# Patient Record
Sex: Female | Born: 1955 | Race: White | Hispanic: No | Marital: Married | Smoking: Former smoker
Health system: Southern US, Community
[De-identification: ages and names within clinical notes are randomized; demographics above are authoritative.]

## PROBLEM LIST (undated history)

## (undated) DIAGNOSIS — F419 Anxiety disorder, unspecified: Secondary | ICD-10-CM

## (undated) DIAGNOSIS — R339 Retention of urine, unspecified: Secondary | ICD-10-CM

## (undated) DIAGNOSIS — N200 Calculus of kidney: Secondary | ICD-10-CM

## (undated) DIAGNOSIS — N83209 Unspecified ovarian cyst, unspecified side: Secondary | ICD-10-CM

## (undated) DIAGNOSIS — K589 Irritable bowel syndrome without diarrhea: Secondary | ICD-10-CM

## (undated) DIAGNOSIS — N390 Urinary tract infection, site not specified: Secondary | ICD-10-CM

## (undated) DIAGNOSIS — I10 Essential (primary) hypertension: Secondary | ICD-10-CM

## (undated) DIAGNOSIS — T7840XA Allergy, unspecified, initial encounter: Secondary | ICD-10-CM

## (undated) DIAGNOSIS — K219 Gastro-esophageal reflux disease without esophagitis: Secondary | ICD-10-CM

## (undated) DIAGNOSIS — K5790 Diverticulosis of intestine, part unspecified, without perforation or abscess without bleeding: Secondary | ICD-10-CM

## (undated) DIAGNOSIS — K2970 Gastritis, unspecified, without bleeding: Secondary | ICD-10-CM

## (undated) DIAGNOSIS — R079 Chest pain, unspecified: Secondary | ICD-10-CM

## (undated) DIAGNOSIS — R200 Anesthesia of skin: Secondary | ICD-10-CM

## (undated) DIAGNOSIS — E785 Hyperlipidemia, unspecified: Secondary | ICD-10-CM

## (undated) DIAGNOSIS — I7300 Raynaud's syndrome without gangrene: Secondary | ICD-10-CM

## (undated) DIAGNOSIS — F329 Major depressive disorder, single episode, unspecified: Secondary | ICD-10-CM

## (undated) DIAGNOSIS — G4700 Insomnia, unspecified: Secondary | ICD-10-CM

## (undated) DIAGNOSIS — C4490 Unspecified malignant neoplasm of skin, unspecified: Secondary | ICD-10-CM

## (undated) DIAGNOSIS — F32A Depression, unspecified: Secondary | ICD-10-CM

## (undated) DIAGNOSIS — I420 Dilated cardiomyopathy: Secondary | ICD-10-CM

## (undated) DIAGNOSIS — I447 Left bundle-branch block, unspecified: Secondary | ICD-10-CM

## (undated) DIAGNOSIS — R002 Palpitations: Secondary | ICD-10-CM

## (undated) DIAGNOSIS — H43393 Other vitreous opacities, bilateral: Secondary | ICD-10-CM

## (undated) DIAGNOSIS — C44509 Unspecified malignant neoplasm of skin of other part of trunk: Secondary | ICD-10-CM

## (undated) DIAGNOSIS — J45909 Unspecified asthma, uncomplicated: Secondary | ICD-10-CM

## (undated) DIAGNOSIS — I5189 Other ill-defined heart diseases: Secondary | ICD-10-CM

## (undated) DIAGNOSIS — I872 Venous insufficiency (chronic) (peripheral): Secondary | ICD-10-CM

## (undated) DIAGNOSIS — R0602 Shortness of breath: Secondary | ICD-10-CM

## (undated) HISTORY — PX: CHOLECYSTECTOMY: SHX55

## (undated) HISTORY — DX: Unspecified malignant neoplasm of skin, unspecified: C44.90

## (undated) HISTORY — PX: FUNCTIONAL ENDOSCOPIC SINUS SURGERY: SUR616

---
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
## 2004-05-29 ENCOUNTER — Ambulatory Visit: Payer: Self-pay | Admitting: Internal Medicine

## 2004-06-14 ENCOUNTER — Ambulatory Visit: Payer: Self-pay

## 2004-06-17 ENCOUNTER — Ambulatory Visit: Payer: Self-pay

## 2004-07-30 ENCOUNTER — Ambulatory Visit: Payer: Self-pay | Admitting: Internal Medicine

## 2004-10-28 ENCOUNTER — Ambulatory Visit: Payer: Self-pay

## 2004-12-17 ENCOUNTER — Ambulatory Visit: Payer: Self-pay | Admitting: Otolaryngology

## 2005-10-14 ENCOUNTER — Ambulatory Visit: Payer: Self-pay | Admitting: Obstetrics and Gynecology

## 2005-10-17 ENCOUNTER — Ambulatory Visit: Payer: Self-pay | Admitting: Obstetrics and Gynecology

## 2006-08-24 ENCOUNTER — Ambulatory Visit: Payer: Self-pay | Admitting: Gastroenterology

## 2006-10-26 ENCOUNTER — Ambulatory Visit: Payer: Self-pay | Admitting: Cardiology

## 2006-11-10 ENCOUNTER — Ambulatory Visit: Payer: Self-pay | Admitting: Obstetrics and Gynecology

## 2007-05-31 ENCOUNTER — Emergency Department: Payer: Self-pay | Admitting: Emergency Medicine

## 2007-05-31 ENCOUNTER — Other Ambulatory Visit: Payer: Self-pay

## 2007-11-09 ENCOUNTER — Emergency Department (HOSPITAL_COMMUNITY): Admission: EM | Admit: 2007-11-09 | Discharge: 2007-11-09 | Payer: Self-pay | Admitting: Emergency Medicine

## 2007-11-10 ENCOUNTER — Ambulatory Visit: Payer: Self-pay

## 2007-11-12 ENCOUNTER — Ambulatory Visit: Payer: Self-pay | Admitting: Obstetrics and Gynecology

## 2008-05-18 ENCOUNTER — Ambulatory Visit: Payer: Self-pay | Admitting: Internal Medicine

## 2008-05-29 ENCOUNTER — Ambulatory Visit: Payer: Self-pay | Admitting: Urology

## 2009-11-27 ENCOUNTER — Ambulatory Visit: Payer: Self-pay | Admitting: Internal Medicine

## 2010-08-04 ENCOUNTER — Emergency Department: Payer: Self-pay | Admitting: Emergency Medicine

## 2010-09-17 ENCOUNTER — Ambulatory Visit: Payer: Self-pay | Admitting: Urology

## 2010-10-22 ENCOUNTER — Ambulatory Visit: Payer: Self-pay | Admitting: Gastroenterology

## 2010-11-07 ENCOUNTER — Ambulatory Visit: Payer: Self-pay | Admitting: Internal Medicine

## 2010-12-04 ENCOUNTER — Ambulatory Visit: Payer: Self-pay | Admitting: Internal Medicine

## 2010-12-05 ENCOUNTER — Ambulatory Visit: Payer: Self-pay | Admitting: Surgery

## 2010-12-06 LAB — PATHOLOGY REPORT

## 2011-04-22 LAB — D-DIMER, QUANTITATIVE: D-Dimer, Quant: 0.33

## 2011-08-15 ENCOUNTER — Ambulatory Visit: Payer: Self-pay | Admitting: Internal Medicine

## 2011-12-08 ENCOUNTER — Ambulatory Visit: Payer: Self-pay

## 2011-12-09 ENCOUNTER — Ambulatory Visit: Payer: Self-pay

## 2012-12-09 ENCOUNTER — Ambulatory Visit: Payer: Self-pay | Admitting: Obstetrics & Gynecology

## 2012-12-16 DIAGNOSIS — R339 Retention of urine, unspecified: Secondary | ICD-10-CM

## 2012-12-16 DIAGNOSIS — N3020 Other chronic cystitis without hematuria: Secondary | ICD-10-CM | POA: Insufficient documentation

## 2012-12-16 DIAGNOSIS — N3941 Urge incontinence: Secondary | ICD-10-CM | POA: Insufficient documentation

## 2012-12-16 DIAGNOSIS — N200 Calculus of kidney: Secondary | ICD-10-CM

## 2012-12-16 HISTORY — DX: Retention of urine, unspecified: R33.9

## 2012-12-16 HISTORY — DX: Calculus of kidney: N20.0

## 2013-03-01 ENCOUNTER — Emergency Department: Payer: Self-pay | Admitting: Emergency Medicine

## 2013-03-01 LAB — URINALYSIS, COMPLETE
Ketone: NEGATIVE
Leukocyte Esterase: NEGATIVE
Protein: NEGATIVE
RBC,UR: 1 /HPF (ref 0–5)
Specific Gravity: 1.004 (ref 1.003–1.030)
WBC UR: NONE SEEN /HPF (ref 0–5)

## 2013-03-01 LAB — COMPREHENSIVE METABOLIC PANEL
Alkaline Phosphatase: 89 U/L (ref 50–136)
BUN: 11 mg/dL (ref 7–18)
Calcium, Total: 9.6 mg/dL (ref 8.5–10.1)
Glucose: 97 mg/dL (ref 65–99)
Osmolality: 277 (ref 275–301)
SGOT(AST): 44 U/L — ABNORMAL HIGH (ref 15–37)
SGPT (ALT): 46 U/L (ref 12–78)

## 2013-03-01 LAB — CBC
HGB: 14.9 g/dL (ref 12.0–16.0)
MCHC: 35.1 g/dL (ref 32.0–36.0)
MCV: 94 fL (ref 80–100)
RBC: 4.52 10*6/uL (ref 3.80–5.20)
RDW: 12.6 % (ref 11.5–14.5)

## 2013-04-11 ENCOUNTER — Ambulatory Visit: Payer: Self-pay | Admitting: Internal Medicine

## 2013-07-06 ENCOUNTER — Ambulatory Visit: Payer: Self-pay | Admitting: Physical Medicine and Rehabilitation

## 2013-11-14 IMAGING — US US EXTREM LOW VENOUS*R*
1 series · 14 of 24 positions shown · non-contrast
Comparison: none

REASON FOR EXAM: Call Report  [id number]   rt leg pain Eval for DVT
COMMENTS:

[Series 1: us extrem low venous*right* · 0.11mm/px · 14 of 37 slices shown]
[im 1/37]
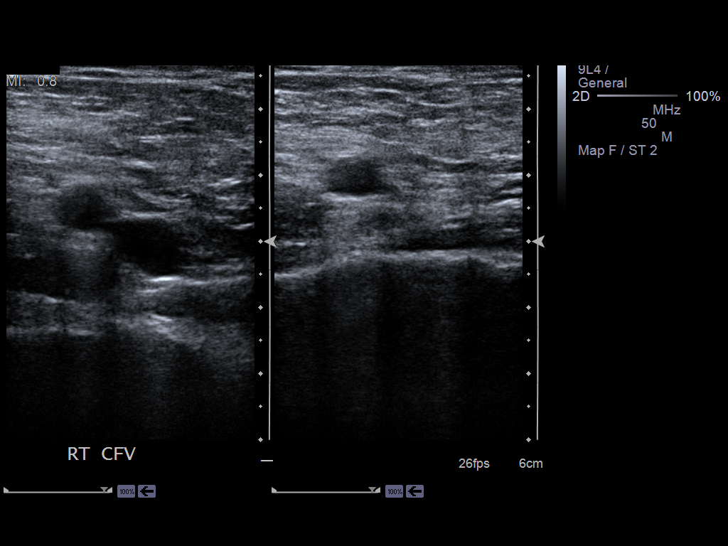
[im 4/37]
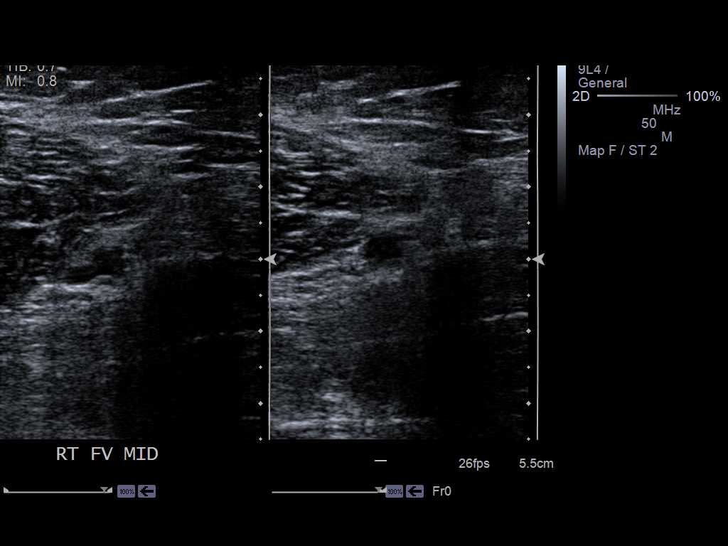
[im 7/37]
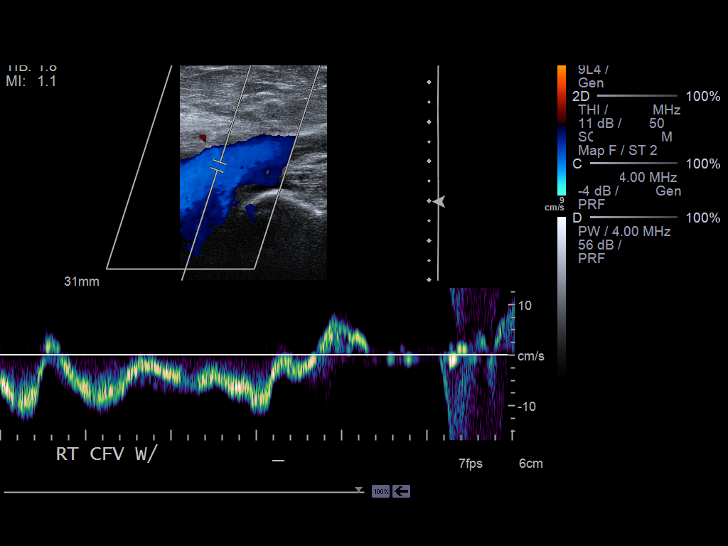
[im 10/37]
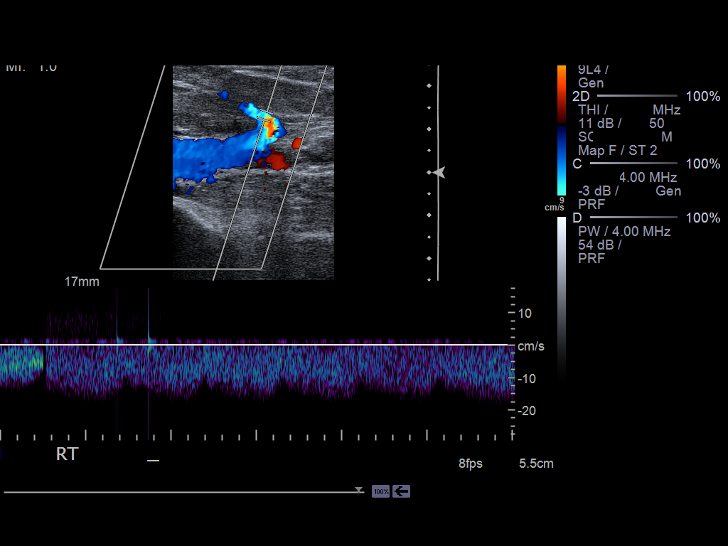
[im 11/37]
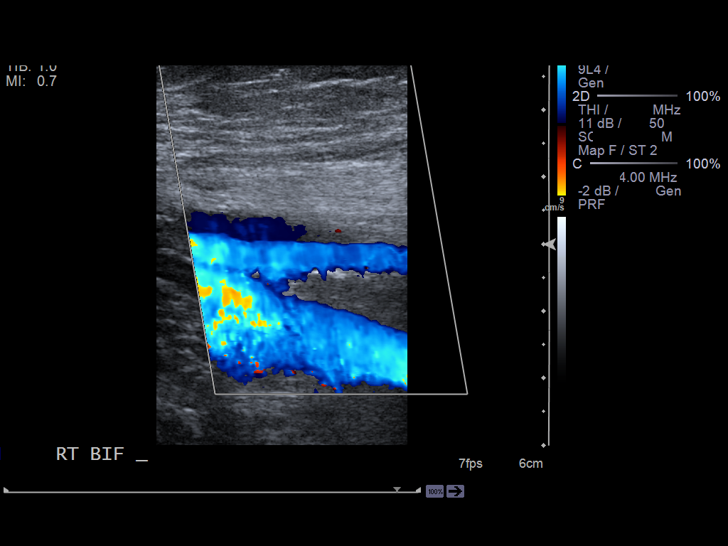
[im 15/37]
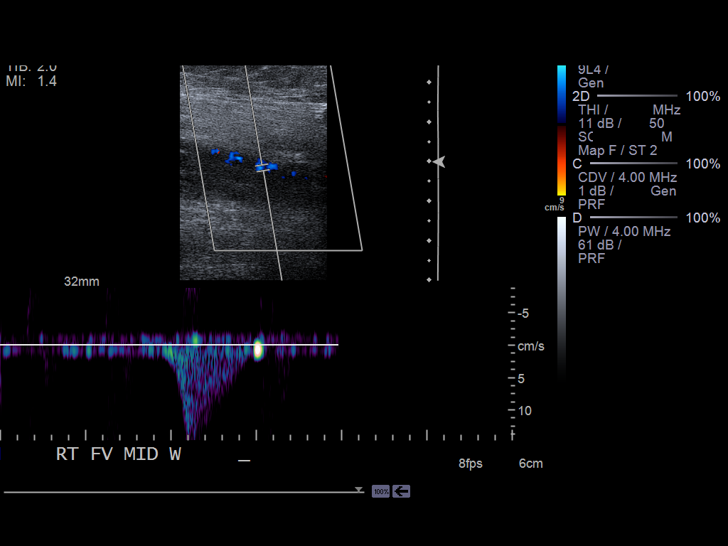
[im 18/37]
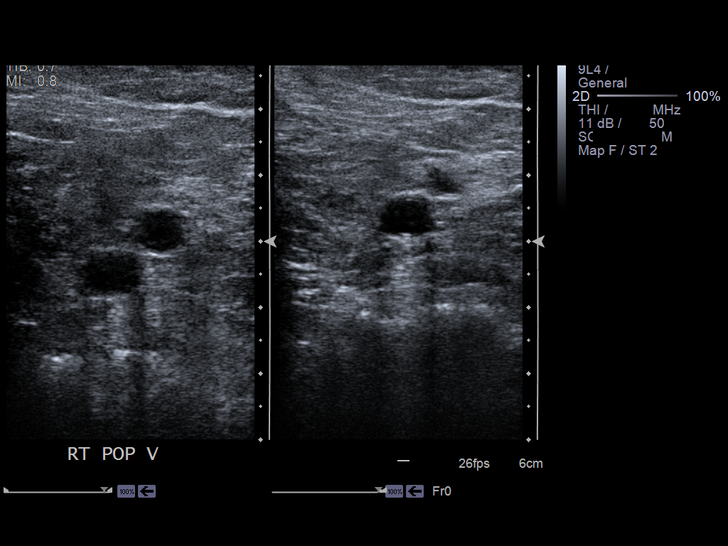
[im 19/37]
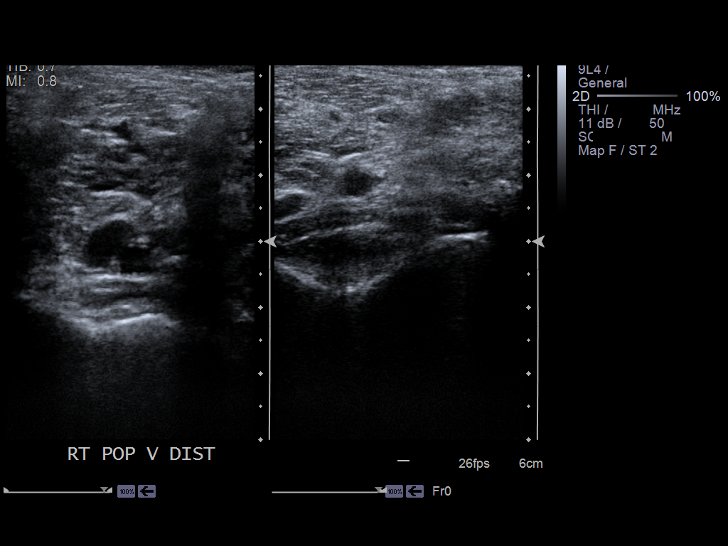
[im 22/37]
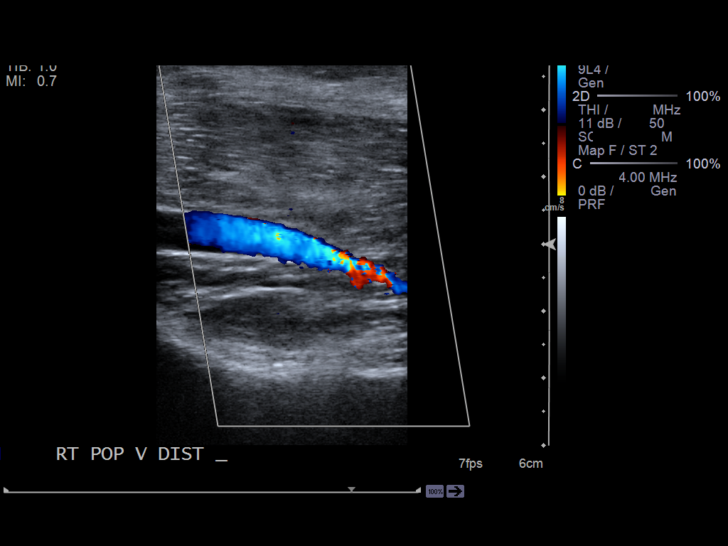
[im 26/37]
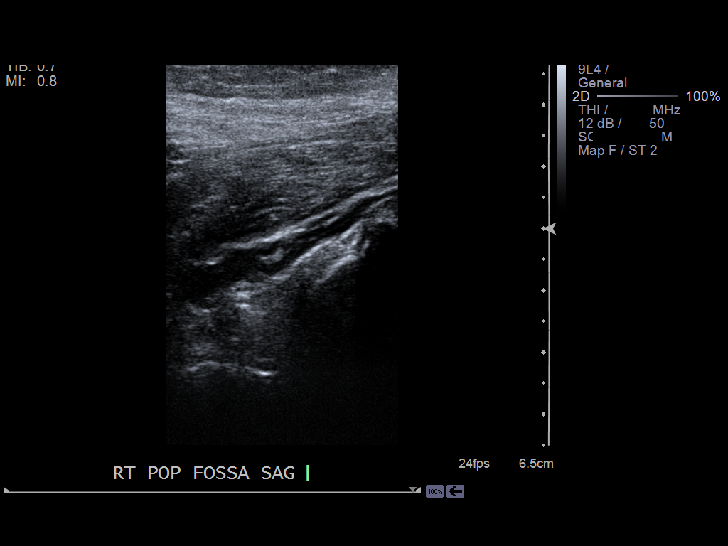
[im 29/37]
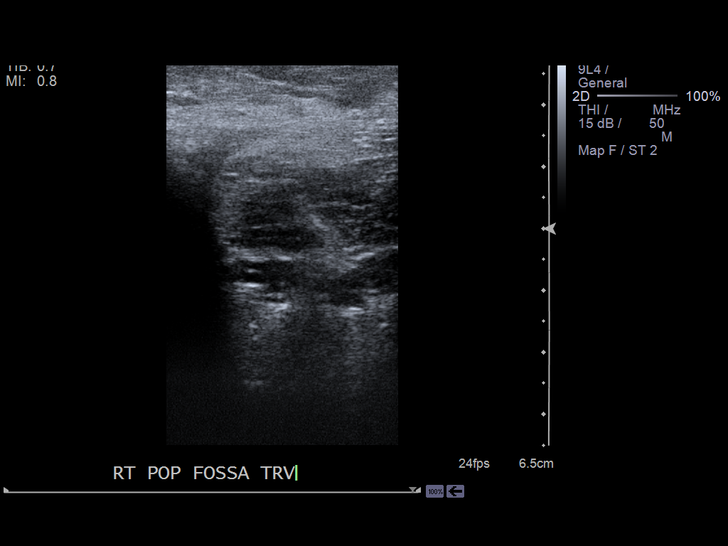
[im 30/37]
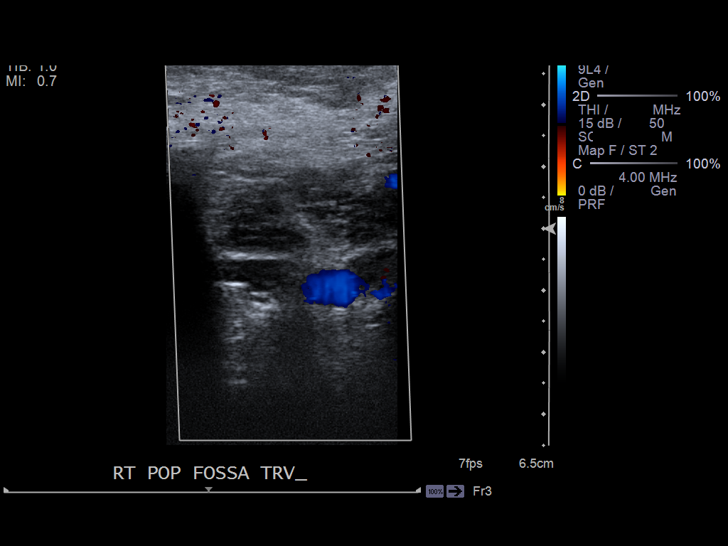
[im 33/37]
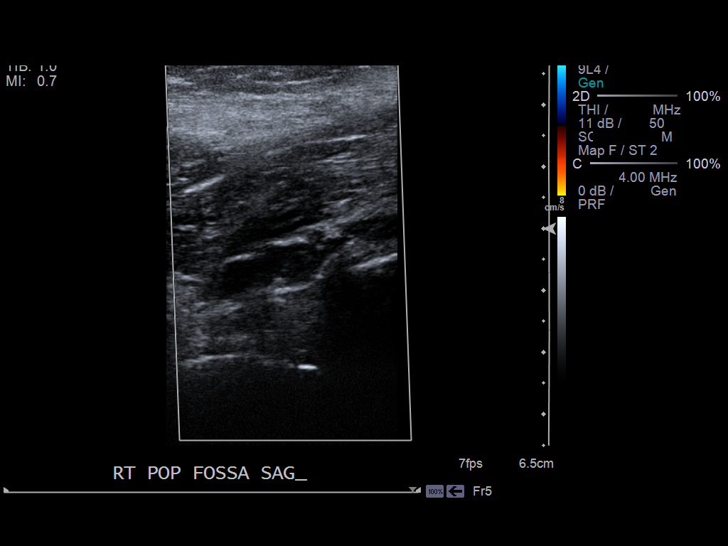
[im 37/37]
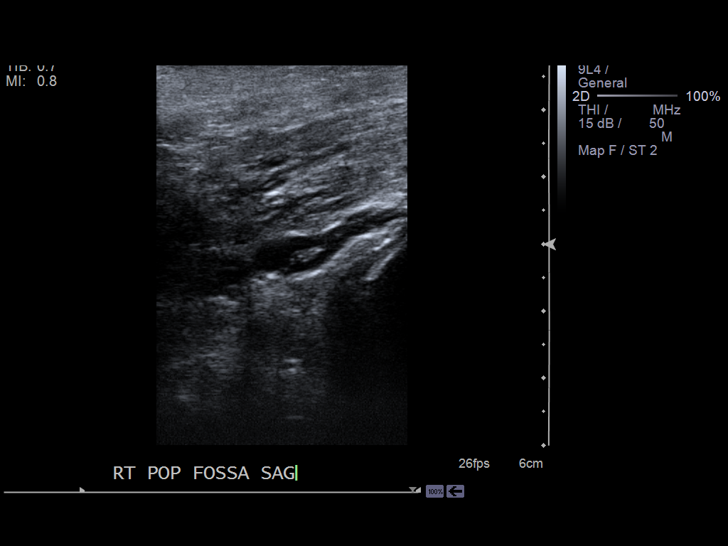

[14 of 24 positions shown; findings below may reference images not displayed]

PROCEDURE:     US  - US DOPPLER LOW EXTR RIGHT  - April 11, 2013 [DATE]

RESULT:     Grayscale and color Doppler techniques were employed to evaluate
the deep veins of the right lower extremity.

The right common femoral, superficial femoral, and popliteal veins are
normally compressible. The waveform patterns are normal and the color flow
images are normal. The response to the augmentation and Valsalva maneuvers
is normal. There is a fluid-filled structure in the popliteal fossa
containing internal echoes most compatible with a Baker's cyst measuring
x 1.3 x 0.6 cm.
IMPRESSION: 1. There is no evidence of thrombus within the right femoral or popliteal
veins.
2. There is likely a popliteal cyst.

A preliminary report was called to Dr. [REDACTED] and the report given
to Belazi Cheek at [DATE] a.m. Today.

[REDACTED]

## 2013-12-13 DIAGNOSIS — K219 Gastro-esophageal reflux disease without esophagitis: Secondary | ICD-10-CM | POA: Insufficient documentation

## 2013-12-13 DIAGNOSIS — J45909 Unspecified asthma, uncomplicated: Secondary | ICD-10-CM

## 2013-12-13 DIAGNOSIS — Z9109 Other allergy status, other than to drugs and biological substances: Secondary | ICD-10-CM | POA: Insufficient documentation

## 2013-12-13 DIAGNOSIS — K589 Irritable bowel syndrome without diarrhea: Secondary | ICD-10-CM | POA: Insufficient documentation

## 2013-12-13 DIAGNOSIS — E785 Hyperlipidemia, unspecified: Secondary | ICD-10-CM

## 2013-12-13 HISTORY — DX: Unspecified asthma, uncomplicated: J45.909

## 2013-12-13 HISTORY — DX: Hyperlipidemia, unspecified: E78.5

## 2013-12-15 ENCOUNTER — Ambulatory Visit: Payer: Self-pay | Admitting: Internal Medicine

## 2014-10-13 ENCOUNTER — Ambulatory Visit: Payer: Self-pay | Admitting: Internal Medicine

## 2014-12-20 ENCOUNTER — Other Ambulatory Visit: Payer: Self-pay | Admitting: Internal Medicine

## 2014-12-20 DIAGNOSIS — Z1231 Encounter for screening mammogram for malignant neoplasm of breast: Secondary | ICD-10-CM

## 2015-01-02 ENCOUNTER — Ambulatory Visit
Admission: RE | Admit: 2015-01-02 | Discharge: 2015-01-02 | Disposition: A | Discharge status: Home / Self Care | Payer: Managed Care, Other (non HMO) | Source: Ambulatory Visit | Attending: Internal Medicine | Admitting: Internal Medicine

## 2015-01-02 DIAGNOSIS — Z1231 Encounter for screening mammogram for malignant neoplasm of breast: Secondary | ICD-10-CM | POA: Insufficient documentation

## 2015-07-05 DIAGNOSIS — I7300 Raynaud's syndrome without gangrene: Secondary | ICD-10-CM

## 2015-07-05 HISTORY — DX: Raynaud's syndrome without gangrene: I73.00

## 2016-01-22 ENCOUNTER — Other Ambulatory Visit: Payer: Self-pay | Admitting: Internal Medicine

## 2016-01-22 DIAGNOSIS — Z1231 Encounter for screening mammogram for malignant neoplasm of breast: Secondary | ICD-10-CM

## 2016-02-05 ENCOUNTER — Ambulatory Visit
Admission: RE | Admit: 2016-02-05 | Discharge: 2016-02-05 | Disposition: A | Discharge status: Home / Self Care | Payer: Managed Care, Other (non HMO) | Source: Ambulatory Visit | Attending: Internal Medicine | Admitting: Internal Medicine

## 2016-02-05 DIAGNOSIS — Z1231 Encounter for screening mammogram for malignant neoplasm of breast: Secondary | ICD-10-CM | POA: Insufficient documentation

## 2016-02-28 ENCOUNTER — Ambulatory Visit: Payer: Managed Care, Other (non HMO) | Attending: Internal Medicine | Admitting: Physical Therapy

## 2016-02-28 DIAGNOSIS — M545 Low back pain, unspecified: Secondary | ICD-10-CM

## 2016-02-28 DIAGNOSIS — M6281 Muscle weakness (generalized): Secondary | ICD-10-CM | POA: Insufficient documentation

## 2016-02-28 NOTE — Therapy (Signed)
[facility] [address] [place], [address] Phone: [phone]   Fax:  [phone]  Physical Therapy Evaluation  Patient Details  Name: Courtney Oneill MRN: [id number] Date of Birth: 1955-07-31 Referring Provider: Doy Hutching  Encounter Date: 02/28/2016      PT End of Session - 02/28/16 0916    Visit Number 1   Number of Visits 9   Date for PT Re-Evaluation 03/27/16   PT Start Time 0830   PT Stop Time 0910   PT Time Calculation (min) 40 min   Activity Tolerance Patient tolerated treatment well      No past medical history on file.  No past surgical history on file.  There were no vitals filed for this visit.       Subjective Assessment - 02/28/16 0827    Subjective Pt reports she has had back pain all her life. 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 She recently had severe L sided back pain and took a course of prednisone several weeks ago. Pt reports "something is different" now, pain is more severe now.  Pain began with significant cold all summer long. Pt reports N/T on L side when she had L sided pain. On R side pt has mild N/T in HS region.   Pertinent History h/o chronic pain.   Limitations Sitting;House hold activities   How long can you sit comfortably? 1 hr   How long can you walk comfortably? "sometimes"   Diagnostic tests None lately.   Patient Stated Goals hurt less. or pain free.   Currently in Pain? Yes   Pain Score 5    Pain Location Back   Aggravating Factors  cleaning tasks, coughing   Pain Relieving Factors stretching            [facility] PT Assessment - 02/28/16 0001      Assessment   Medical Diagnosis acute bilateral low back pain without sciatica   Referring Provider Sparks   Prior Therapy at some point in the past     Balance Screen   Has the patient fallen in the past 6 months No   Has the patient had a decrease in activity level because of  a fear of falling?  No   Is the patient reluctant to leave their home because of a fear of falling?  No     Home Ecologist residence     Prior Function   Level of Independence Independent   Vocation Full time employment   Vocation Requirements cleaning houses, lifting, stretching, awkward positions   Leisure reading     Sensation   Light Touch Appears Intact     Functional Tests   Functional tests Squat;Lunges     Squat   Comments no pain, good function     Lunges   Comments no pain, poor femoral control with R lunge     ROM / Strength   AROM / PROM / Strength AROM;PROM;Strength     AROM   Overall AROM Comments pain with lumbar rotation, worse on L. pain with flexion, no pain with extension. Hip AROM is WNL except painful with end range on R.     PROM   Overall PROM  Within functional limits for tasks performed     Strength   Overall Strength Comments poor TA control.     Palpation   Spinal mobility CPAs grade II  L1-L5, incr. pain successively lower   SI assessment  - for SI tests   Palpation comment pain with palpation of R paraspinals.            Objective: CPAs grade III L1-L5. Mobility improved with this. Following performance of this.   Gentle STM performed on R paraspinals, noted no pain with lumbar rotation following this.  Attempted lumbar flexion ball rolls, these incr. Pain.  Had pt perform rotation stretch 3x1 min with deep breathing.                 PT Education - 02/28/16 0914    Education provided Yes   Education Details course of PT   Person(s) Educated Patient   Methods Explanation   Comprehension Verbalized understanding             PT Long Term Goals - 02/28/16 [id number]      PT LONG TERM GOAL #1   Title Pt will have no pain with lumbar rotation to be able to back up out of driveway pain free   Baseline pain with lumbar rotation 5/10   Time 4   Period Weeks   Status New     PT  LONG TERM GOAL #2   Title Pt will e able to engage TA for 7 sec. to improve motor control in awkward positions.   Baseline 3 sec. engagement, would like to progress to 7 sec.   Time 4   Period Weeks   Status New     PT LONG TERM GOAL #3   Title Pt will improve mODI from 26% to 20% indicating improvement in self reported disability due to LBP   Baseline 26%   Time 4   Period Weeks   Status New               Plan - 02/28/16 0916    Clinical Impression Statement Pt is a pleasant 60 y/o female with c/o chronic back pain with recent exacerbation due to recent colds. Currently presents with signs of paraspinal hypertonicity, hypomobile lumbar spine, and some pain with palpation of glutes. Pt responded well to manual intervention during assessment and would benefit from continued PT to address these issues.   Rehab Potential Good   Clinical Impairments Affecting Rehab Potential chronic pain/good response to PT    PT Frequency 2x / week   PT Duration 4 weeks   PT Treatment/Interventions ADLs/Self Care Home Management;Aquatic Therapy;Therapeutic exercise;Manual techniques;Dry needling;Passive range of motion;Patient/family education   PT Next Visit Plan progress stretching routine      Patient will benefit from skilled therapeutic intervention in order to improve the following deficits and impairments:  Pain, Postural dysfunction, Decreased strength, Impaired flexibility, Decreased range of motion, Decreased endurance, Hypomobility, Improper body mechanics  Visit Diagnosis: Bilateral low back pain without sciatica - Plan: PT plan of care cert/re-cert  Muscle weakness (generalized) - Plan: PT plan of care cert/re-cert     Problem List There are no active problems to display for this patient.   Fisher,Benjamin PT DPT 02/28/2016, 9:30 AM  [facility] [address] [place], [address] Phone: [phone]    Fax:  [phone]  Name: DEWANDA AGOSTA MRN: [id number] Date of Birth: 06-25-1956

## 2016-03-03 ENCOUNTER — Ambulatory Visit: Payer: Managed Care, Other (non HMO)

## 2016-03-03 DIAGNOSIS — M545 Low back pain, unspecified: Secondary | ICD-10-CM

## 2016-03-03 DIAGNOSIS — M6281 Muscle weakness (generalized): Secondary | ICD-10-CM

## 2016-03-03 NOTE — Patient Instructions (Addendum)
  Quadriceps Set (Prone)   With toes supporting lower legs, squeeze rear end muscles and tighten thigh muscles to straighten knees. Hold _5___ seconds. Relax. Repeat __10__ times per set. Do ___3_ sets per session. Do ___1_ sessions per day.  http://orth.exer.us/726   Copyright  VHI. All rights reserved.     Supine transversus abdominis and pelvic floor contraction 10x3 with 5 second holds 3x/day, 5 days per week given as part of her HEP. Handout provided.

## 2016-03-03 NOTE — Therapy (Signed)
[facility] [address] [place], [address] Phone: [phone]   Fax:  [phone]  Physical Therapy Treatment  Patient Details  Name: Courtney Oneill MRN: [id number] Date of Birth: [birth year] Referring Provider: Doy Hutching  Encounter Date: 03/03/2016      PT End of Session - 03/03/16 0906    Visit Number 2   Number of Visits 9   Date for PT Re-Evaluation 03/27/16   PT Start Time 0906   PT Stop Time 0953   PT Time Calculation (min) 47 min   Activity Tolerance Patient tolerated treatment well      No past medical history on file.  No past surgical history on file.  There were no vitals filed for this visit.      Subjective Assessment - 03/03/16 0907    Subjective Back is a little better than it was. It's sore. Was sore Thursday night she could not move. 4/10 currently (sore). Mopping, vacuuming, bending, twisting, turning bothers her back.     Pertinent History h/o chronic pain.   Limitations Sitting;House hold activities   How long can you sit comfortably? 1 hr   How long can you walk comfortably? "sometimes"   Diagnostic tests None lately.   Patient Stated Goals hurt less. or pain free.   Currently in Pain? Yes   Pain Score 4   soreness                          Objective:  Manual therapy  CPAs grade III L1-L5. Stiffness at around L1 which mobility improved after manual therapy.  Gentle STM performed on R paraspinals   Ther-ex  Directed patient with prone glute max set 10x 5 seconds each LE for 2 sets. R low back discomfort initially which decreased with repetition.   Prone on elbows x 2 min  Supine transversus abdominis contraction 10x5 seconds  Then with pelvic floor contraction 10x5  Then with hip fallouts 5x2 each LE. More control for R LE compared to L LE. L pelvic rotation with L hip fallout observed. Cues for control.    Reviewed HEP. Pt demonstrated and  verbalized understanding.   Improved exercise technique, movement at target joints, use of target muscles after mod verbal, visual, tactile cues.             PT Education - 03/03/16 [phone]    Education provided Yes   Education Details ther-ex, HEP   Person(s) Educated Patient   Methods Explanation;Demonstration;Tactile cues;Verbal cues;Handout   Comprehension Verbalized understanding;Returned demonstration             PT Long Term Goals - 02/28/16 [id number]      PT LONG TERM GOAL #1   Title Pt will have no pain with lumbar rotation to be able to back up out of driveway pain free   Baseline pain with lumbar rotation 5/10   Time 4   Period Weeks   Status New     PT LONG TERM GOAL #2   Title Pt will e able to engage TA for 7 sec. to improve motor control in awkward positions.   Baseline 3 sec. engagement, would like to progress to 7 sec.   Time 4   Period Weeks   Status New     PT LONG TERM GOAL #3   Title Pt will improve mODI from 26% to 20% indicating improvement in self reported disability due to  LBP   Baseline 26%   Time 4   Period Weeks   Status New               Plan - 03/03/16 [id number]    Clinical Impression Statement Improved lumbar mobility after manual therapy. Pt demonstrates slight difficulty with lumbopelvic control with hip fallout exercise L > R. Pt tolerated session well without aggravation of symptoms.  Pt states that her back feels sore but more lose.     Rehab Potential Good   Clinical Impairments Affecting Rehab Potential chronic pain/good response to PT    PT Frequency 2x / week   PT Duration 4 weeks   PT Treatment/Interventions ADLs/Self Care Home Management;Aquatic Therapy;Therapeutic exercise;Manual techniques;Dry needling;Passive range of motion;Patient/family education   PT Next Visit Plan progress stretching routine      Patient will benefit from skilled therapeutic intervention in order to improve the following deficits and  impairments:  Pain, Postural dysfunction, Decreased strength, Impaired flexibility, Decreased range of motion, Decreased endurance, Hypomobility, Improper body mechanics  Visit Diagnosis: Bilateral low back pain without sciatica  Muscle weakness (generalized)     Problem List There are no active problems to display for this patient.   Joneen Boers PT, DPT   03/03/2016, 11:00 AM   [facility] [address] [place], [address] Phone: [phone]   Fax:  [phone]  Name: Courtney Oneill MRN: [id number] Date of Birth: Dec 13, 1955

## 2016-03-04 ENCOUNTER — Ambulatory Visit: Payer: Managed Care, Other (non HMO)

## 2016-03-05 ENCOUNTER — Ambulatory Visit: Payer: Managed Care, Other (non HMO)

## 2016-03-05 DIAGNOSIS — M545 Low back pain, unspecified: Secondary | ICD-10-CM

## 2016-03-05 DIAGNOSIS — M6281 Muscle weakness (generalized): Secondary | ICD-10-CM

## 2016-03-05 NOTE — Therapy (Signed)
[facility] [address] [place], [address] Phone: [phone]   Fax:  [phone]  Physical Therapy Treatment  Patient Details  Name: Courtney Oneill MRN: [id number] Date of Birth: [birth year] Referring Provider: Doy Hutching  Encounter Date: 03/05/2016      PT End of Session - 03/05/16 1533    Visit Number 3   Number of Visits 9   Date for PT Re-Evaluation 03/27/16   PT Start Time [id number]   PT Stop Time 1617   PT Time Calculation (min) 44 min   Activity Tolerance Patient tolerated treatment well      No past medical history on file.  No past surgical history on file.  There were no vitals filed for this visit.      Subjective Assessment - 03/05/16 1533    Subjective Back is sore. Sore from last session. Also very tired today from work. Had a busy day cleaning. Feels like PT is helping (including the manual therapy).    Pertinent History h/o chronic pain.   Limitations Sitting;House hold activities   How long can you sit comfortably? 1 hr   How long can you walk comfortably? "sometimes"   Diagnostic tests None lately.   Patient Stated Goals hurt less. or pain free.   Currently in Pain? Yes   Pain Score 5   4-5/10 currently                         Objective:  Manual therapy  P to A to L L5 and L4 transverse process grade 3. Decreased TTP R L 5 and R L 4 transverse process afterwards. Improved mobility after manual therapy  Gentle STM performed on R paraspinals   Ther-ex  Directed patient with prone glute max set 10x 5 seconds each LE for 1 sets.    Supine transversus abdominis and pelvic floor contraction with hip fallouts 10x each LE. Improved pelvic control with L hip fallouts after cues and repetition.   Bridge (with bilateral ankle DF, bilateral shoulder extension isometrics) 10x  Then with addition of red band resisting hip abduction/ER 10x2   Forward step up  onto 3 inch step with emphasis on femoral control (max cues including mirror) 5x each LE    Improved exercise technique, movement at target joints, use of target muscles after mod to max verbal, visual, tactile cues.      Improved L4, and L5 mobility after manual therapy with decreased back pain. Improved pelvic control for L hip during hip fallouts after cues and practice. Difficulty with femoral control with step up exercise.            PT Education - 03/05/16 1600    Education provided Yes   Education Details ther-ex   [employer]) Educated Patient   Methods Explanation;Demonstration;Tactile cues;Verbal cues   Comprehension Verbalized understanding;Returned demonstration             PT Long Term Goals - 02/28/16 [id number]      PT LONG TERM GOAL #1   Title Pt will have no pain with lumbar rotation to be able to back up out of driveway pain free   Baseline pain with lumbar rotation 5/10   Time 4   Period Weeks   Status New     PT LONG TERM GOAL #2   Title Pt will e able to engage TA for 7 sec. to improve motor control in awkward positions.  Baseline 3 sec. engagement, would like to progress to 7 sec.   Time 4   Period Weeks   Status New     PT LONG TERM GOAL #3   Title Pt will improve mODI from 26% to 20% indicating improvement in self reported disability due to LBP   Baseline 26%   Time 4   Period Weeks   Status New               Plan - 03/05/16 1600    Clinical Impression Statement Improved L4, and L5 mobility after manual therapy with decreased back pain. Improved pelvic control for L hip during hip fallouts after cues and practice. Difficulty with femoral control with step up exercise.    Rehab Potential Good   Clinical Impairments Affecting Rehab Potential chronic pain/good response to PT    PT Frequency 2x / week   PT Duration 4 weeks   PT Treatment/Interventions ADLs/Self Care Home Management;Aquatic Therapy;Therapeutic exercise;Manual  techniques;Dry needling;Passive range of motion;Patient/family education   PT Next Visit Plan progress stretching routine      Patient will benefit from skilled therapeutic intervention in order to improve the following deficits and impairments:  Pain, Postural dysfunction, Decreased strength, Impaired flexibility, Decreased range of motion, Decreased endurance, Hypomobility, Improper body mechanics  Visit Diagnosis: Bilateral low back pain without sciatica  Muscle weakness (generalized)     Problem List There are no active problems to display for this patient.   Joneen Boers PT, DPT   03/05/2016, 5:31 PM  [facility] [address], [place], [address] Phone: [phone]   Fax:  [phone]  Name: Courtney Oneill MRN: [id number] Date of Birth: 03/09/1956

## 2016-03-19 ENCOUNTER — Ambulatory Visit: Payer: Managed Care, Other (non HMO)

## 2016-03-24 ENCOUNTER — Ambulatory Visit: Payer: Managed Care, Other (non HMO)

## 2016-03-24 DIAGNOSIS — M545 Low back pain, unspecified: Secondary | ICD-10-CM

## 2016-03-24 DIAGNOSIS — M6281 Muscle weakness (generalized): Secondary | ICD-10-CM

## 2016-03-24 NOTE — Patient Instructions (Signed)
  Caudal Rotation: Hip Roll, Neutral Lordosis - Supine   Lie with knees bent, feet flat. Lower knees out to right side, rotating hips and trunk. Hold for 5 seconds. Return. Repeat for other side. Hold for 5 seconds Repeat __10__ times per set. Do _3___ sets per session.    Copyright  VHI. All rights reserved.

## 2016-03-24 NOTE — Therapy (Signed)
[facility] [address] [place], [address] Phone: [phone]   Fax:  [phone]  Physical Therapy Treatment  Patient Details  Name: Courtney Oneill MRN: [id number] Date of Birth: 08-May-1956 Referring Provider: Fulton Reek, MD  Encounter Date: 03/24/2016      PT End of Session - 03/24/16 1621    Visit Number 4   Number of Visits 17   Date for PT Re-Evaluation 04/24/16   PT Start Time [id number]   PT Stop Time 1702   PT Time Calculation (min) 41 min   Activity Tolerance Patient tolerated treatment well      No past medical history on file.  No past surgical history on file.  There were no vitals filed for this visit.      Subjective Assessment - 03/24/16 1623    Subjective Back is a little sore. 3.5-4/10 currently. Back still bothers her when she turns when driving. Pt also states that if she is not doing anything physical, her back feels better since after starting PT.    Pertinent History h/o chronic pain.   Limitations Sitting;House hold activities   How long can you sit comfortably? 1 hr   How long can you walk comfortably? "sometimes"   Diagnostic tests None lately.   Patient Stated Goals hurt less. or pain free.   Currently in Pain? Yes   Pain Score 4   3.5-4/10 low back soreness.              Objective:  There-ex:   Standing lumbar: flexion low back discomfort  Extension: low back discomfort (same as lumbar flexion) R and L side bend: low back soreness R trunk rotation: L low back soreness, L trunk rotation: L low back soreness > R trunk rotation.    Discomfort eases with rest quickly  standing R trunk rotation 10x5 seconds. Slight increase in discomfort.  Supine lower trunk rotation 10x5 seconds each side for 2 sets.   Supine bridge 10x2  Reviewed plan of care 2x/week for 4 weeks  Supine transversus abdominis contraction 10x 10 seconds. Good contraction palpated.    Forward step up onto 3 inch step with emphasis on femoral control (mod to max cues including mirror) 5x each LE    Improved exercise technique, movement at target joints, use of target muscles after min to mod verbal, visual, tactile cues.     Manual therapy  Prone P to A to L L5, R L5, R L4 transverse processes grade 3. Decreased soreness and no pain with trunk rotation afterwards. Improved mobility after manual therapy.      Good transversus abdominis contraction for 10 seconds.    Patient has demonstrated improved ability to activate and maintain her transversus abdominis muscle, as well as overall decreasing current pain level since initial evaluation. Back pain also decreases after manual therapy to promote mobility. Patient still demonstrates some stiffness in her low back, core weakness, decreased femoral control, and difficulty performing functional tasks such as performing chores as well as turning when backing her car up and would benefit from continued skilled physical therapy services to address the aforementioned deficits.            PT Education - 03/24/16 1653    Education provided Yes   Education Details ther-ex, HEP, plan of care   Person(s) Educated Patient   Methods Explanation;Demonstration;Tactile cues;Verbal cues;Handout   Comprehension Returned demonstration;Verbalized understanding  PT Long Term Goals - 03/24/16 1931      PT LONG TERM GOAL #1   Title Pt will have no pain with lumbar rotation to be able to back up out of driveway pain free   Baseline pain with lumbar rotation 5/10; pain with lumbar rotation (03/24/2016)   Time 4   Period Weeks   Status On-going     PT LONG TERM GOAL #2   Title Pt will e able to engage TA for 7 sec. to improve motor control in awkward positions.   Baseline 3 sec. engagement, would like to progress to 7 sec; Pt abld to maintain TA contraction for at least 10 seconds   Time 4   Period Weeks    Status Achieved     PT LONG TERM GOAL #3   Title Pt will improve mODI from 26% to 20% indicating improvement in self reported disability due to LBP   Baseline 26%   Time 4   Period Weeks   Status On-going               Plan - 03/24/16 1931    Clinical Impression Statement Patient has demonstrated improved ability to activate and maintain her transversus abdominis muscle, as well as overall decreasing current pain level since initial evaluation. Back pain also decreases after manual therapy to promote mobility. Patient still demonstrates some stiffness in her low back, core weakness, decreased femoral control, and difficulty performing functional tasks such as performing chores as well as turning when backing her car up and would benefit from continued skilled physical therapy services to address the aforementioned deficits.   Rehab Potential Good   Clinical Impairments Affecting Rehab Potential chronic pain/good response to PT    PT Frequency 2x / week   PT Duration 4 weeks   PT Treatment/Interventions ADLs/Self Care Home Management;Aquatic Therapy;Therapeutic exercise;Manual techniques;Dry needling;Passive range of motion;Patient/family education   PT Next Visit Plan progress stretching routine      Patient will benefit from skilled therapeutic intervention in order to improve the following deficits and impairments:  Pain, Postural dysfunction, Decreased strength, Impaired flexibility, Decreased range of motion, Decreased endurance, Hypomobility, Improper body mechanics  Visit Diagnosis: Bilateral low back pain without sciatica - Plan: PT plan of care cert/re-cert  Muscle weakness (generalized) - Plan: PT plan of care cert/re-cert     Problem List There are no active problems to display for this patient.    Joneen Boers PT, DPT  03/24/2016, 8:14 PM  [facility] [address] [place],  [address] Phone: [phone]   Fax:  [phone]  Name: Courtney Oneill MRN: [id number] Date of Birth: 10/08/1955

## 2016-03-26 ENCOUNTER — Ambulatory Visit: Payer: Managed Care, Other (non HMO)

## 2016-03-26 DIAGNOSIS — M6281 Muscle weakness (generalized): Secondary | ICD-10-CM

## 2016-03-26 DIAGNOSIS — M545 Low back pain, unspecified: Secondary | ICD-10-CM

## 2016-03-26 NOTE — Therapy (Signed)
[facility] [address], [place], [address] Phone: [phone]   Fax:  [phone]  Physical Therapy Treatment  Patient Details  Name: YANEIRY CULLIMORE MRN: [id number] Date of Birth: [birth year] Referring Provider: Fulton Reek, MD  Encounter Date: 03/26/2016      PT End of Session - 03/26/16 1636    Visit Number 5   Number of Visits 17   Date for PT Re-Evaluation 04/24/16   PT Start Time [id number]   PT Stop Time 1722   PT Time Calculation (min) 46 min   Activity Tolerance Patient tolerated treatment well      No past medical history on file.  No past surgical history on file.  There were no vitals filed for this visit.      Subjective Assessment - 03/26/16 1637    Subjective Back feels tired. Just a little pain in the L side. Back was a good sore after last session.    Pertinent History h/o chronic pain.   Limitations Sitting;House hold activities   How long can you sit comfortably? 1 hr   How long can you walk comfortably? "sometimes"   Diagnostic tests None lately.   Patient Stated Goals hurt less. or pain free.   Currently in Pain? Yes   Pain Score 5   4.5-5/10                  Objectives  L ASIS more palpable compared to R L LE longer in both supine and long sitting.    There-ex:  Directed patient with   R S/L bows and arrows 10x. Increased L low back discomfort  L S/L bows and arrows 10x3. Decreased R low back discomfort.   Standing R and L trunk rotation multiple times. Decreased pain with L trunk rotation with glute max squeeze   supine L hip extension isometrics with L hip flexed to end range 3x 5 seconds for 5 sets  Seated kegel/pelvic floor contractions 10x 5 seconds  Supine transversus abdominis and pelvic floor contraction 10x5 seconds   Then with hip fallouts 10x each LE. Some difficulty with lumbopelvic control     Improved exercise technique, movement at  target joints, use of target muscles after mod verbal, visual, tactile cues.       Manual therapy  Prone P to A to L L5, R L5, transverse processesgrade 3. Decreased L low back pain with standing R and L trunk rotation   Good transversus abdominis contraction palpated. Decreased low back pain with trunk rotation with manual therapy to promote mobility at L5. Some difficulty with lumbopelvic control observed with supine hip fallout exercise.        PT Education - 03/26/16 1659    Education provided Yes   Education Details ther-ex   [employer]) Educated Patient   Methods Explanation;Demonstration;Tactile cues;Verbal cues   Comprehension Verbalized understanding;Returned demonstration             PT Long Term Goals - 03/24/16 1931      PT LONG TERM GOAL #1   Title Pt will have no pain with lumbar rotation to be able to back up out of driveway pain free   Baseline pain with lumbar rotation 5/10; pain with lumbar rotation (03/24/2016)   Time 4   Period Weeks   Status On-going     PT LONG TERM GOAL #2   Title Pt will e able to engage TA for 7 sec. to  improve motor control in awkward positions.   Baseline 3 sec. engagement, would like to progress to 7 sec; Pt abld to maintain TA contraction for at least 10 seconds   Time 4   Period Weeks   Status Achieved     PT LONG TERM GOAL #3   Title Pt will improve mODI from 26% to 20% indicating improvement in self reported disability due to LBP   Baseline 26%   Time 4   Period Weeks   Status On-going               Plan - 03/26/16 1715    Clinical Impression Statement Good transversus abdominis contraction palpated. Decreased low back pain with trunk rotation with manual therapy to promote mobility at L5. Some difficulty with lumbopelvic control observed with supine hip fallout exercise.    Rehab Potential Good   Clinical Impairments Affecting Rehab Potential chronic pain/good response to PT    PT Frequency 2x / week    PT Duration 4 weeks   PT Treatment/Interventions ADLs/Self Care Home Management;Aquatic Therapy;Therapeutic exercise;Manual techniques;Dry needling;Passive range of motion;Patient/family education   PT Next Visit Plan progress stretching routine      Patient will benefit from skilled therapeutic intervention in order to improve the following deficits and impairments:  Pain, Postural dysfunction, Decreased strength, Impaired flexibility, Decreased range of motion, Decreased endurance, Hypomobility, Improper body mechanics  Visit Diagnosis: Bilateral low back pain without sciatica  Muscle weakness (generalized)     Problem List There are no active problems to display for this patient.   Joneen Boers PT, DPT   03/26/2016, 5:31 PM  [facility] [address], [place], [address] Phone: [phone]   Fax:  [phone]  Name: CLYDIA DURFEY MRN: [id number] Date of Birth: 14-Aug-1955

## 2016-04-01 ENCOUNTER — Ambulatory Visit: Payer: Managed Care, Other (non HMO) | Attending: Internal Medicine

## 2016-04-01 DIAGNOSIS — M545 Low back pain, unspecified: Secondary | ICD-10-CM

## 2016-04-01 DIAGNOSIS — M6281 Muscle weakness (generalized): Secondary | ICD-10-CM | POA: Diagnosis present

## 2016-04-01 NOTE — Therapy (Signed)
[facility] [address], [place], [address] Phone: [phone]   Fax:  [phone]  Physical Therapy Treatment  Patient Details  Name: AALA MALLIK MRN: [id number] Date of Birth: 10/10/1955 Referring Provider: Fulton Reek, MD  Encounter Date: 04/01/2016      PT End of Session - 04/01/16 1533    Visit Number 6   Number of Visits 17   Date for PT Re-Evaluation 04/24/16   PT Start Time [id number]   PT Stop Time 1617   PT Time Calculation (min) 44 min   Activity Tolerance Patient tolerated treatment well      No past medical history on file.  No past surgical history on file.  There were no vitals filed for this visit.      Subjective Assessment - 04/01/16 1534    Subjective Back hurts. 5/10 currently. Back hurt that night and the next day after last session. Got better Saturday evening and Sunday (3-4/10). A lot of bending, vacuuming, and twisting increased her back pain today.    Pertinent History h/o chronic pain.   Limitations Sitting;House hold activities   How long can you sit comfortably? 1 hr   How long can you walk comfortably? "sometimes"   Diagnostic tests None lately.   Patient Stated Goals hurt less. or pain free.   Currently in Pain? Yes   Pain Score 5                                  PT Education - 04/01/16 1540    Education provided Yes   Education Details ther-ex, HEP   Person(s) Educated Patient   Methods Explanation;Demonstration;Tactile cues;Verbal cues;Handout   Comprehension Returned demonstration;Verbalized understanding     Objectives   There-ex:  Directed patient with supine transversus abdominis and pelvic floor contraction 10x5 seconds  Then with hip fall outs 10x2 each LE  S/L clamshells 10x2 each LE with 5 second holds with core muscle activation  Seated hip hinging 10x3 with PVC bar behind back for tactile cues to hinge from hips  instead of moving from back.  Sit <> stand from elevated mat table with hip hinging and PVC bar behind back for tactile cues 10x2   Decreased back pain to 3/10   Squat to elevated mat table with cues for hip hinging and PVC bar behind back for tactile cues for proper posture. 10x2  Standing static mini lunge with emphasis on neutral back 10x each LE. Cues needed for femoral control   Improved exercise technique, movement at target joints, use of target muscles after mod verbal, visual, tactile cues.      Difficulty with lumbopelvic control with L LE movement with hip fallouts resulting in L pelvic rotation. Cues needed to correct. Decreased low back pain to 3/10 after stability exercises.              PT Long Term Goals - 03/24/16 1931      PT LONG TERM GOAL #1   Title Pt will have no pain with lumbar rotation to be able to back up out of driveway pain free   Baseline pain with lumbar rotation 5/10; pain with lumbar rotation (03/24/2016)   Time 4   Period Weeks   Status On-going     PT LONG TERM GOAL #2   Title Pt will e able to engage TA for 7 sec.  to improve motor control in awkward positions.   Baseline 3 sec. engagement, would like to progress to 7 sec; Pt abld to maintain TA contraction for at least 10 seconds   Time 4   Period Weeks   Status Achieved     PT LONG TERM GOAL #3   Title Pt will improve mODI from 26% to 20% indicating improvement in self reported disability due to LBP   Baseline 26%   Time 4   Period Weeks   Status On-going               Plan - 04/01/16 1546    Clinical Impression Statement Difficulty with lumbopelvic control with L LE movement with hip fallouts resulting in L pelvic rotation. Cues needed to correct. Decreased low back pain to 3/10 after stability exercises.    Rehab Potential Good   Clinical Impairments Affecting Rehab Potential chronic pain/good response to PT    PT Frequency 2x / week   PT Duration 4 weeks   PT  Treatment/Interventions ADLs/Self Care Home Management;Aquatic Therapy;Therapeutic exercise;Manual techniques;Dry needling;Passive range of motion;Patient/family education   PT Next Visit Plan progress stretching routine      Patient will benefit from skilled therapeutic intervention in order to improve the following deficits and impairments:  Pain, Postural dysfunction, Decreased strength, Impaired flexibility, Decreased range of motion, Decreased endurance, Hypomobility, Improper body mechanics  Visit Diagnosis: Bilateral low back pain without sciatica  Muscle weakness (generalized)     Problem List There are no active problems to display for this patient.   Joneen Boers PT, DPT   04/01/2016, 7:49 PM  [facility] [address] [place], [address] Phone: [phone]   Fax:  [phone]  Name: HALLYE BLANDIN MRN: [id number] Date of Birth: 25-May-1956

## 2016-04-01 NOTE — Patient Instructions (Signed)
  Gave seated hip hinging (handout provided), and sit <> stand from elevated surface with hip hinging as part of her HEP. 10x3 each. Pt demonstrated and verbalized understanding.

## 2016-04-03 ENCOUNTER — Ambulatory Visit: Payer: Managed Care, Other (non HMO)

## 2016-04-28 ENCOUNTER — Encounter: Payer: Self-pay | Admitting: *Deleted

## 2016-08-26 ENCOUNTER — Encounter
Admission: RE | Disposition: A | Discharge status: Home / Self Care | Payer: Self-pay | Source: Ambulatory Visit | Attending: Gastroenterology

## 2016-08-26 ENCOUNTER — Encounter: Payer: Self-pay | Admitting: *Deleted

## 2016-08-26 ENCOUNTER — Ambulatory Visit: Payer: Managed Care, Other (non HMO) | Admitting: Anesthesiology

## 2016-08-26 ENCOUNTER — Ambulatory Visit
Admission: RE | Admit: 2016-08-26 | Discharge: 2016-08-26 | Disposition: A | Discharge status: Home / Self Care | Payer: Managed Care, Other (non HMO) | Source: Ambulatory Visit | Attending: Gastroenterology | Admitting: Gastroenterology

## 2016-08-26 DIAGNOSIS — Z8371 Family history of colonic polyps: Secondary | ICD-10-CM | POA: Insufficient documentation

## 2016-08-26 DIAGNOSIS — J45909 Unspecified asthma, uncomplicated: Secondary | ICD-10-CM | POA: Diagnosis not present

## 2016-08-26 DIAGNOSIS — K648 Other hemorrhoids: Secondary | ICD-10-CM | POA: Insufficient documentation

## 2016-08-26 DIAGNOSIS — F419 Anxiety disorder, unspecified: Secondary | ICD-10-CM | POA: Insufficient documentation

## 2016-08-26 DIAGNOSIS — G4700 Insomnia, unspecified: Secondary | ICD-10-CM | POA: Insufficient documentation

## 2016-08-26 DIAGNOSIS — F329 Major depressive disorder, single episode, unspecified: Secondary | ICD-10-CM | POA: Insufficient documentation

## 2016-08-26 DIAGNOSIS — K5730 Diverticulosis of large intestine without perforation or abscess without bleeding: Secondary | ICD-10-CM | POA: Insufficient documentation

## 2016-08-26 DIAGNOSIS — Z87891 Personal history of nicotine dependence: Secondary | ICD-10-CM | POA: Diagnosis not present

## 2016-08-26 DIAGNOSIS — I10 Essential (primary) hypertension: Secondary | ICD-10-CM | POA: Diagnosis not present

## 2016-08-26 DIAGNOSIS — Z79899 Other long term (current) drug therapy: Secondary | ICD-10-CM | POA: Insufficient documentation

## 2016-08-26 DIAGNOSIS — I872 Venous insufficiency (chronic) (peripheral): Secondary | ICD-10-CM | POA: Insufficient documentation

## 2016-08-26 DIAGNOSIS — Z1211 Encounter for screening for malignant neoplasm of colon: Secondary | ICD-10-CM | POA: Diagnosis not present

## 2016-08-26 DIAGNOSIS — K589 Irritable bowel syndrome without diarrhea: Secondary | ICD-10-CM | POA: Insufficient documentation

## 2016-08-26 DIAGNOSIS — K644 Residual hemorrhoidal skin tags: Secondary | ICD-10-CM | POA: Diagnosis not present

## 2016-08-26 DIAGNOSIS — K219 Gastro-esophageal reflux disease without esophagitis: Secondary | ICD-10-CM | POA: Insufficient documentation

## 2016-08-26 HISTORY — DX: Allergy, unspecified, initial encounter: T78.40XA

## 2016-08-26 HISTORY — DX: Gastritis, unspecified, without bleeding: K29.70

## 2016-08-26 HISTORY — DX: Anxiety disorder, unspecified: F41.9

## 2016-08-26 HISTORY — DX: Depression, unspecified: F32.A

## 2016-08-26 HISTORY — PX: COLONOSCOPY WITH PROPOFOL: SHX5780

## 2016-08-26 HISTORY — DX: Irritable bowel syndrome, unspecified: K58.9

## 2016-08-26 HISTORY — DX: Gastro-esophageal reflux disease without esophagitis: K21.9

## 2016-08-26 HISTORY — DX: Insomnia, unspecified: G47.00

## 2016-08-26 HISTORY — DX: Unspecified asthma, uncomplicated: J45.909

## 2016-08-26 HISTORY — DX: Major depressive disorder, single episode, unspecified: F32.9

## 2016-08-26 HISTORY — DX: Essential (primary) hypertension: I10

## 2016-08-26 HISTORY — DX: Venous insufficiency (chronic) (peripheral): I87.2

## 2016-08-26 SURGERY — COLONOSCOPY WITH PROPOFOL
Anesthesia: General

## 2016-08-26 MED ORDER — LIDOCAINE HCL (PF) 2 % IJ SOLN
INTRAMUSCULAR | Status: DC | PRN
  Administered 2016-08-26: 50 mg

## 2016-08-26 MED ORDER — SODIUM CHLORIDE 0.9 % IV SOLN
INTRAVENOUS | Status: DC
  Administered 2016-08-26: 07:00:00 via INTRAVENOUS

## 2016-08-26 MED ORDER — MIDAZOLAM HCL 2 MG/2ML IJ SOLN
INTRAMUSCULAR | Status: AC
  Filled 2016-08-26: qty 2

## 2016-08-26 MED ORDER — MIDAZOLAM HCL 5 MG/5ML IJ SOLN
INTRAMUSCULAR | Status: DC | PRN
  Administered 2016-08-26 (×2): 2 mg via INTRAVENOUS

## 2016-08-26 MED ORDER — SODIUM CHLORIDE 0.9 % IV SOLN: INTRAVENOUS | Status: DC

## 2016-08-26 MED ORDER — PROPOFOL 500 MG/50ML IV EMUL
INTRAVENOUS | Status: DC | PRN
  Administered 2016-08-26: 75 ug/kg/min via INTRAVENOUS

## 2016-08-26 MED ORDER — FENTANYL CITRATE (PF) 100 MCG/2ML IJ SOLN
INTRAMUSCULAR | Status: DC | PRN
  Administered 2016-08-26: 50 ug via INTRAVENOUS
  Administered 2016-08-26 (×2): 25 ug via INTRAVENOUS

## 2016-08-26 MED ORDER — FENTANYL CITRATE (PF) 100 MCG/2ML IJ SOLN
INTRAMUSCULAR | Status: AC
  Filled 2016-08-26: qty 2

## 2016-08-26 MED ORDER — PROPOFOL 500 MG/50ML IV EMUL
INTRAVENOUS | Status: AC
Start: 2016-08-26 — End: 2016-08-26
  Filled 2016-08-26: qty 50

## 2016-08-26 MED ORDER — PROPOFOL 10 MG/ML IV BOLUS
INTRAVENOUS | Status: DC | PRN
  Administered 2016-08-26: 30 mg via INTRAVENOUS
  Administered 2016-08-26: 50 mg via INTRAVENOUS

## 2016-08-26 NOTE — Anesthesia Postprocedure Evaluation (Signed)
Anesthesia Post Note  Patient: Courtney Oneill  Procedure(s) Performed: Procedure(s) (LRB): COLONOSCOPY WITH PROPOFOL (N/A)  Patient location during evaluation: Endoscopy Anesthesia Type: General Level of consciousness: awake and alert Pain management: pain level controlled Vital Signs Assessment: post-procedure vital signs reviewed and stable Respiratory status: spontaneous breathing, nonlabored ventilation, respiratory function stable and patient connected to nasal cannula oxygen Cardiovascular status: blood pressure returned to baseline and stable Postop Assessment: no signs of nausea or vomiting Anesthetic complications: no     Last Vitals:  Vitals:   08/26/16 0816 08/26/16 0836  BP: 127/66 124/64  Pulse: 76 (!) 58  Resp: 15 14  Temp:      Last Pain:  Vitals:   08/26/16 0806  TempSrc: Tympanic                 Martha Clan

## 2016-08-26 NOTE — Transfer of Care (Signed)
Immediate Anesthesia Transfer of Care Note  Patient: Courtney Oneill  Procedure(s) Performed: Procedure(s): COLONOSCOPY WITH PROPOFOL (N/A)  Patient Location: PACU  Anesthesia Type:General  Level of Consciousness: sedated  Airway & Oxygen Therapy: Patient Spontanous Breathing and Patient connected to nasal cannula oxygen  Post-op Assessment: Report given to RN and Post -op Vital signs reviewed and stable  Post vital signs: Reviewed and stable  Last Vitals:  Vitals:   08/26/16 0702  BP: (!) 145/76  Pulse: 86  Resp: 16  Temp: 36.6 C    Last Pain:  Vitals:   08/26/16 0702  TempSrc: Tympanic         Complications: No apparent anesthesia complications

## 2016-08-26 NOTE — H&P (Signed)
Outpatient short stay form Pre-procedure 08/26/2016 7:31 AM Lollie Sails MD  Primary Physician: Dr. Fulton Reek  Reason for visit:  Colonoscopy  History of present illness:  Patient is a 61 year old female presenting today as above. She has a personal history of colonoscopy done about 5 years ago. She did have some hyperplastic polyps that time. She is presenting today for screening. She takes no aspirin or blood thinning agents. She tolerated her prep well.    Current Facility-Administered Medications:  .  0.9 %  sodium chloride infusion, , Intravenous, Continuous, Lollie Sails, MD, Last Rate: 20 mL/hr at 08/26/16 0716 .  0.9 %  sodium chloride infusion, , Intravenous, Continuous, Lollie Sails, MD  Prescriptions Prior to Admission  Medication Sig Dispense Refill Last Dose  . amLODipine (NORVASC) 5 MG tablet Take 5 mg by mouth daily.   08/26/2016 at 0600  . beclomethasone (QVAR) 80 MCG/ACT inhaler Inhale 1 puff into the lungs daily.   Past Week at Unknown time  . cetirizine-pseudoephedrine (ZYRTEC-D) 5-120 MG tablet Take 1 tablet by mouth 2 (two) times daily.   08/25/2016 at Unknown time  . conjugated estrogens (PREMARIN) vaginal cream Place 1 Applicatorful vaginally daily.     . ergocalciferol (VITAMIN D2) 50000 units capsule Take 50,000 Units by mouth once a week.   Past Week at Unknown time  . fluticasone (FLONASE) 50 MCG/ACT nasal spray Place 1 spray into both nostrils 2 (two) times daily.   Past Month at Unknown time  . hydrochlorothiazide (HYDRODIURIL) 25 MG tablet Take 25 mg by mouth daily.   Past Week at Unknown time  . montelukast (SINGULAIR) 10 MG tablet Take 10 mg by mouth at bedtime.   08/25/2016 at Unknown time  . ondansetron (ZOFRAN-ODT) 4 MG disintegrating tablet Take 4 mg by mouth every 8 (eight) hours as needed for nausea or vomiting.   08/25/2016 at Unknown time  . RABEprazole (ACIPHEX) 20 MG tablet Take 20 mg by mouth daily.   08/25/2016 at Unknown time  .  zolpidem (AMBIEN CR) 12.5 MG CR tablet Take 12.5 mg by mouth at bedtime as needed for sleep.   08/25/2016 at Unknown time  . albuterol (ACCUNEB) 0.63 MG/3ML nebulizer solution Take 1 ampule by nebulization every 6 (six) hours as needed for wheezing.   Taking  . ALPRAZolam (XANAX) 0.5 MG tablet Take 0.5 mg by mouth at bedtime as needed for anxiety.   Taking  . cyclobenzaprine (FLEXERIL) 10 MG tablet Take 10 mg by mouth 3 (three) times daily as needed for muscle spasms.   Taking  . meloxicam (MOBIC) 7.5 MG tablet Take 7.5 mg by mouth daily.   Not Taking at Unknown time     Allergies  Allergen Reactions  . Sulfa Antibiotics Other (See Comments)    UNKNOWN REACTION     Past Medical History:  Diagnosis Date  . Allergic state   . Anxiety   . Asthma without status asthmaticus   . Depression   . Gastritis   . GERD (gastroesophageal reflux disease)   . Hypertension   . IBS (irritable bowel syndrome)   . Insomnia   . Venous insufficiency     Review of systems:      Physical Exam    Heart and lungs: Regular rate and rhythm without rub or gallop, lungs are bilaterally clear.    HEENT: Normocephalic atraumatic eyes are anicteric    Other:     Pertinant exam for procedure: Soft nontender nondistended bowel sounds  positive normoactive.    Planned proceedures: Colonoscopy and indicated procedures.have discussed the risks benefits and complications of procedures to include not limited to bleeding, infection, perforation and the risk of sedation and the patient wishes to proceed.    Lollie Sails, MD Gastroenterology 08/26/2016  7:31 AM

## 2016-08-26 NOTE — Anesthesia Preprocedure Evaluation (Signed)
Anesthesia Evaluation  Patient identified by MRN, date of birth, ID band Patient awake    Reviewed: Allergy & Precautions, H&P , NPO status , Patient's Chart, lab work & pertinent test results, reviewed documented beta blocker date and time   History of Anesthesia Complications Negative for: history of anesthetic complications  Airway Mallampati: I  TM Distance: >3 FB Neck ROM: full    Dental  (+) Caps, Dental Advidsory Given, Missing   Pulmonary neg shortness of breath, asthma , neg sleep apnea, neg COPD, neg recent URI, former smoker,           Cardiovascular Exercise Tolerance: Good hypertension, (-) angina(-) CAD, (-) Past MI, (-) Cardiac Stents and (-) CABG (-) dysrhythmias (-) Valvular Problems/Murmurs     Neuro/Psych PSYCHIATRIC DISORDERS (Depression and anxiety) negative neurological ROS     GI/Hepatic Neg liver ROS, GERD  Medicated,  Endo/Other  negative endocrine ROS  Renal/GU negative Renal ROS  negative genitourinary   Musculoskeletal   Abdominal   Peds  Hematology negative hematology ROS (+)   Anesthesia Other Findings Past Medical History: No date: Allergic state No date: Anxiety No date: Asthma without status asthmaticus No date: Depression No date: Gastritis No date: GERD (gastroesophageal reflux disease) No date: Hypertension No date: IBS (irritable bowel syndrome) No date: Insomnia No date: Venous insufficiency   Reproductive/Obstetrics negative OB ROS                             Anesthesia Physical Anesthesia Plan  ASA: II  Anesthesia Plan: General   Post-op Pain Management:    Induction:   Airway Management Planned:   Additional Equipment:   Intra-op Plan:   Post-operative Plan:   Informed Consent: I have reviewed the patients History and Physical, chart, labs and discussed the procedure including the risks, benefits and alternatives for the  proposed anesthesia with the patient or authorized representative who has indicated his/her understanding and acceptance.   Dental Advisory Given  Plan Discussed with: Anesthesiologist, CRNA and Surgeon  Anesthesia Plan Comments:         Anesthesia Quick Evaluation

## 2016-08-26 NOTE — Anesthesia Post-op Follow-up Note (Cosign Needed)
Anesthesia QCDR form completed.        

## 2016-08-26 NOTE — Op Note (Signed)
[facility] Gastroenterology Patient Name: Courtney Oneill Procedure Date: 08/26/2016 7:32 AM MRN: [id number] Account #: [id number] Date of Birth: 08-25-1955 Admit Type: Outpatient Age: 61 Room: [facility] ENDO ROOM 1 Gender: Female Note Status: Finalized Procedure:            Colonoscopy Indications:          Colon cancer screening in patient at increased risk:                        Family history of 1st-degree relative with colon polyps Providers:            Lollie Sails, MD Referring MD:         Leonie Douglas. Doy Hutching, MD (Referring MD) Medicines:            Monitored Anesthesia Care Complications:        No immediate complications. Procedure:            Pre-Anesthesia Assessment:                       - ASA Grade Assessment: II - A patient with mild                        systemic disease.                       After obtaining informed consent, the colonoscope was                        passed under direct vision. Throughout the procedure,                        the patient's blood pressure, pulse, and oxygen                        saturations were monitored continuously. The                        Colonoscope was introduced through the anus and                        advanced to the the cecum, identified by appendiceal                        orifice and ileocecal valve. The quality of the bowel                        preparation was good. Findings:      Multiple small-mouthed diverticula were found in the sigmoid colon and       descending colon.      The retroflexed view of the distal rectum and anal verge was normal and       showed no anal or rectal abnormalities.      Non-bleeding external and internal hemorrhoids were found during digital       exam and during anoscopy. The hemorrhoids were small.      The digital rectal exam was normal otherwise. Impression:           - Diverticulosis in the sigmoid colon and in the  descending colon.                      - The distal rectum and anal verge are normal on                        retroflexion view.                       - Non-bleeding external and internal hemorrhoids.                       - No specimens collected. Recommendation:       - Discharge patient to home.                       - Repeat colonoscopy in 5 years for screening purposes. Procedure Code(s):    --- Professional ---                       [phone], Colonoscopy, flexible; diagnostic, including                        collection of specimen(s) by brushing or washing, when                        performed (separate procedure) CPT copyright 2016 American Medical Association. All rights reserved. The codes documented in this report are preliminary and upon coder review may  be revised to meet current compliance requirements. Lollie Sails, MD 08/26/2016 8:08:44 AM This report has been signed electronically. Number of Addenda: 0 Note Initiated On: 08/26/2016 7:32 AM Scope Withdrawal Time: 0 hours 8 minutes 50 seconds  Total Procedure Duration: 0 hours 22 minutes 5 seconds       [facility]

## 2016-08-27 ENCOUNTER — Encounter: Payer: Self-pay | Admitting: Gastroenterology

## 2016-12-30 ENCOUNTER — Other Ambulatory Visit: Payer: Self-pay | Admitting: Internal Medicine

## 2016-12-30 DIAGNOSIS — M79604 Pain in right leg: Secondary | ICD-10-CM

## 2016-12-30 DIAGNOSIS — R609 Edema, unspecified: Secondary | ICD-10-CM

## 2016-12-30 DIAGNOSIS — Z1239 Encounter for other screening for malignant neoplasm of breast: Secondary | ICD-10-CM

## 2017-01-06 ENCOUNTER — Ambulatory Visit
Admission: RE | Admit: 2017-01-06 | Discharge: 2017-01-06 | Disposition: A | Discharge status: Home / Self Care | Payer: Managed Care, Other (non HMO) | Source: Ambulatory Visit | Attending: Internal Medicine | Admitting: Internal Medicine

## 2017-01-06 DIAGNOSIS — R609 Edema, unspecified: Secondary | ICD-10-CM

## 2017-01-06 DIAGNOSIS — M79604 Pain in right leg: Secondary | ICD-10-CM

## 2017-01-12 ENCOUNTER — Ambulatory Visit: Payer: Managed Care, Other (non HMO)

## 2017-01-22 ENCOUNTER — Other Ambulatory Visit: Payer: Self-pay | Admitting: Podiatry

## 2017-01-22 MED ORDER — CEFAZOLIN SODIUM-DEXTROSE 2-4 GM/100ML-% IV SOLN: 2.0000 g | INTRAVENOUS | Status: DC

## 2017-01-23 ENCOUNTER — Ambulatory Visit: Payer: Managed Care, Other (non HMO) | Admitting: Anesthesiology

## 2017-01-23 ENCOUNTER — Encounter: Payer: Self-pay | Admitting: *Deleted

## 2017-01-23 ENCOUNTER — Ambulatory Visit
Admission: RE | Admit: 2017-01-23 | Discharge: 2017-01-23 | Disposition: A | Discharge status: Home / Self Care | Payer: Managed Care, Other (non HMO) | Source: Ambulatory Visit | Attending: Podiatry | Admitting: Podiatry

## 2017-01-23 ENCOUNTER — Encounter
Admission: RE | Disposition: A | Discharge status: Home / Self Care | Payer: Self-pay | Source: Ambulatory Visit | Attending: Podiatry

## 2017-01-23 DIAGNOSIS — S8261XA Displaced fracture of lateral malleolus of right fibula, initial encounter for closed fracture: Secondary | ICD-10-CM | POA: Diagnosis present

## 2017-01-23 DIAGNOSIS — K219 Gastro-esophageal reflux disease without esophagitis: Secondary | ICD-10-CM | POA: Diagnosis not present

## 2017-01-23 DIAGNOSIS — J45909 Unspecified asthma, uncomplicated: Secondary | ICD-10-CM | POA: Insufficient documentation

## 2017-01-23 DIAGNOSIS — F418 Other specified anxiety disorders: Secondary | ICD-10-CM | POA: Diagnosis not present

## 2017-01-23 DIAGNOSIS — Z7951 Long term (current) use of inhaled steroids: Secondary | ICD-10-CM | POA: Insufficient documentation

## 2017-01-23 DIAGNOSIS — I10 Essential (primary) hypertension: Secondary | ICD-10-CM | POA: Diagnosis not present

## 2017-01-23 DIAGNOSIS — Z79899 Other long term (current) drug therapy: Secondary | ICD-10-CM | POA: Insufficient documentation

## 2017-01-23 DIAGNOSIS — X58XXXA Exposure to other specified factors, initial encounter: Secondary | ICD-10-CM | POA: Diagnosis not present

## 2017-01-23 DIAGNOSIS — Z9049 Acquired absence of other specified parts of digestive tract: Secondary | ICD-10-CM | POA: Diagnosis not present

## 2017-01-23 DIAGNOSIS — Z87891 Personal history of nicotine dependence: Secondary | ICD-10-CM | POA: Insufficient documentation

## 2017-01-23 DIAGNOSIS — Z791 Long term (current) use of non-steroidal anti-inflammatories (NSAID): Secondary | ICD-10-CM | POA: Insufficient documentation

## 2017-01-23 HISTORY — PX: ORIF ANKLE FRACTURE: SHX5408

## 2017-01-23 SURGERY — OPEN REDUCTION INTERNAL FIXATION (ORIF) ANKLE FRACTURE
Anesthesia: General | Laterality: Right | Wound class: Clean

## 2017-01-23 MED ORDER — FENTANYL CITRATE (PF) 100 MCG/2ML IJ SOLN
INTRAMUSCULAR | Status: DC | PRN
Start: 2017-01-23 — End: 2017-01-23
  Administered 2017-01-23 (×2): 50 ug via INTRAVENOUS

## 2017-01-23 MED ORDER — DEXAMETHASONE SODIUM PHOSPHATE 10 MG/ML IJ SOLN
INTRAMUSCULAR | Status: AC
  Filled 2017-01-23: qty 1

## 2017-01-23 MED ORDER — ONDANSETRON HCL 4 MG/2ML IJ SOLN
INTRAMUSCULAR | Status: AC
  Filled 2017-01-23: qty 2

## 2017-01-23 MED ORDER — ROPIVACAINE HCL 5 MG/ML IJ SOLN
INTRAMUSCULAR | Status: AC
  Filled 2017-01-23: qty 30

## 2017-01-23 MED ORDER — PROPOFOL 10 MG/ML IV BOLUS
INTRAVENOUS | Status: DC | PRN
  Administered 2017-01-23: 20 mg via INTRAVENOUS
  Administered 2017-01-23: 150 mg via INTRAVENOUS
  Administered 2017-01-23: 100 mg via INTRAVENOUS
  Administered 2017-01-23: 50 mg via INTRAVENOUS
  Administered 2017-01-23: 20 mg via INTRAVENOUS

## 2017-01-23 MED ORDER — OXYCODONE-ACETAMINOPHEN 5-325 MG PO TABS: 1.0000 | ORAL_TABLET | ORAL | 0 refills | Status: DC | PRN

## 2017-01-23 MED ORDER — ACETAMINOPHEN 10 MG/ML IV SOLN
INTRAVENOUS | Status: AC
  Filled 2017-01-23: qty 100

## 2017-01-23 MED ORDER — ONDANSETRON HCL 4 MG PO TABS: 4.0000 mg | ORAL_TABLET | Freq: Four times a day (QID) | ORAL | Status: DC | PRN

## 2017-01-23 MED ORDER — ROPIVACAINE HCL 5 MG/ML IJ SOLN
INTRAMUSCULAR | Status: DC | PRN
  Administered 2017-01-23: 20 mL via PERINEURAL
  Administered 2017-01-23: 10 mL via PERINEURAL

## 2017-01-23 MED ORDER — BUPIVACAINE HCL (PF) 0.25 % IJ SOLN
INTRAMUSCULAR | Status: AC
  Filled 2017-01-23: qty 30

## 2017-01-23 MED ORDER — ACETAMINOPHEN 10 MG/ML IV SOLN
INTRAVENOUS | Status: DC | PRN
  Administered 2017-01-23: 1000 mg via INTRAVENOUS

## 2017-01-23 MED ORDER — ONDANSETRON HCL 4 MG/2ML IJ SOLN
INTRAMUSCULAR | Status: DC | PRN
  Administered 2017-01-23: 4 mg via INTRAVENOUS

## 2017-01-23 MED ORDER — FENTANYL CITRATE (PF) 100 MCG/2ML IJ SOLN
INTRAMUSCULAR | Status: AC
  Filled 2017-01-23: qty 2

## 2017-01-23 MED ORDER — BUPIVACAINE HCL (PF) 0.5 % IJ SOLN
INTRAMUSCULAR | Status: AC
  Filled 2017-01-23: qty 30

## 2017-01-23 MED ORDER — DEXAMETHASONE SODIUM PHOSPHATE 10 MG/ML IJ SOLN
INTRAMUSCULAR | Status: DC | PRN
  Administered 2017-01-23: 10 mg via INTRAVENOUS

## 2017-01-23 MED ORDER — FENTANYL CITRATE (PF) 100 MCG/2ML IJ SOLN
50.0000 ug | Freq: Once | INTRAMUSCULAR | Status: AC
  Administered 2017-01-23: 50 ug via INTRAVENOUS

## 2017-01-23 MED ORDER — BUPIVACAINE HCL 0.25 % IJ SOLN
INTRAMUSCULAR | Status: DC | PRN
  Administered 2017-01-23: 15 mL

## 2017-01-23 MED ORDER — FENTANYL CITRATE (PF) 100 MCG/2ML IJ SOLN
25.0000 ug | INTRAMUSCULAR | Status: DC | PRN
  Administered 2017-01-23 (×2): 25 ug via INTRAVENOUS
  Administered 2017-01-23: 50 ug via INTRAVENOUS

## 2017-01-23 MED ORDER — MIDAZOLAM HCL 2 MG/2ML IJ SOLN
1.0000 mg | Freq: Once | INTRAMUSCULAR | Status: AC
  Administered 2017-01-23: 1 mg via INTRAVENOUS

## 2017-01-23 MED ORDER — ONDANSETRON HCL 4 MG/2ML IJ SOLN
4.0000 mg | Freq: Four times a day (QID) | INTRAMUSCULAR | Status: DC | PRN
  Administered 2017-01-23: 4 mg via INTRAVENOUS

## 2017-01-23 MED ORDER — POVIDONE-IODINE 7.5 % EX SOLN
Freq: Once | CUTANEOUS | Status: DC
  Filled 2017-01-23: qty 118

## 2017-01-23 MED ORDER — CEFAZOLIN SODIUM-DEXTROSE 2-4 GM/100ML-% IV SOLN
INTRAVENOUS | Status: AC
  Filled 2017-01-23: qty 100

## 2017-01-23 MED ORDER — OXYCODONE-ACETAMINOPHEN 5-325 MG PO TABS
1.0000 | ORAL_TABLET | ORAL | Status: DC | PRN
  Administered 2017-01-23: 1 via ORAL

## 2017-01-23 MED ORDER — LIDOCAINE HCL (PF) 1 % IJ SOLN
INTRAMUSCULAR | Status: AC
  Filled 2017-01-23: qty 30

## 2017-01-23 MED ORDER — LACTATED RINGERS IV SOLN
Freq: Once | INTRAVENOUS | Status: AC
  Administered 2017-01-23: 12:00:00 via INTRAVENOUS

## 2017-01-23 MED ORDER — FENTANYL CITRATE (PF) 100 MCG/2ML IJ SOLN
INTRAMUSCULAR | Status: AC
  Administered 2017-01-23: 50 ug via INTRAVENOUS
  Filled 2017-01-23: qty 2

## 2017-01-23 MED ORDER — MIDAZOLAM HCL 2 MG/2ML IJ SOLN
INTRAMUSCULAR | Status: AC
  Administered 2017-01-23: 1 mg via INTRAVENOUS
  Filled 2017-01-23: qty 2

## 2017-01-23 MED ORDER — OXYCODONE-ACETAMINOPHEN 5-325 MG PO TABS
ORAL_TABLET | ORAL | Status: AC
  Filled 2017-01-23: qty 1

## 2017-01-23 MED ORDER — CEFAZOLIN SODIUM-DEXTROSE 2-3 GM-% IV SOLR
INTRAVENOUS | Status: DC | PRN
  Administered 2017-01-23: 2 g via INTRAVENOUS

## 2017-01-23 MED ORDER — LACTATED RINGERS IV SOLN
INTRAVENOUS | Status: DC | PRN
  Administered 2017-01-23: 14:00:00 via INTRAVENOUS

## 2017-01-23 MED ORDER — LIDOCAINE HCL (PF) 1 % IJ SOLN
INTRAMUSCULAR | Status: AC
  Filled 2017-01-23: qty 5

## 2017-01-23 MED ORDER — GLYCOPYRROLATE 0.2 MG/ML IJ SOLN
INTRAMUSCULAR | Status: DC | PRN
  Administered 2017-01-23: 0.2 mg via INTRAVENOUS

## 2017-01-23 MED ORDER — LIDOCAINE HCL (PF) 1 % IJ SOLN
INTRAMUSCULAR | Status: DC | PRN
  Administered 2017-01-23: 1 mL via INTRADERMAL
  Administered 2017-01-23: 40 mL via INTRADERMAL

## 2017-01-23 SURGICAL SUPPLY — 47 items
BANDAGE ELASTIC 4 LF NS (GAUZE/BANDAGES/DRESSINGS) ×2 IMPLANT
BANDAGE STRETCH 3X4.1 STRL (GAUZE/BANDAGES/DRESSINGS) ×2 IMPLANT
BIT DRILL CALIBRATED 2.7 (BIT) ×2 IMPLANT
BLADE SURG 15 STRL LF DISP TIS (BLADE) IMPLANT
BLADE SURG 15 STRL SS (BLADE)
BNDG COHESIVE 4X5 TAN STRL (GAUZE/BANDAGES/DRESSINGS) ×2 IMPLANT
BNDG ESMARK 4X12 TAN STRL LF (GAUZE/BANDAGES/DRESSINGS) ×2 IMPLANT
BNDG GAUZE 4.5X4.1 6PLY STRL (MISCELLANEOUS) ×2 IMPLANT
CANISTER SUCT 1200ML W/VALVE (MISCELLANEOUS) ×2 IMPLANT
DRAPE C-ARM XRAY 36X54 (DRAPES) ×2 IMPLANT
DRAPE C-ARMOR (DRAPES) IMPLANT
DURAPREP 26ML APPLICATOR (WOUND CARE) ×2 IMPLANT
ELECT REM PT RETURN 9FT ADLT (ELECTROSURGICAL) ×2
ELECTRODE REM PT RTRN 9FT ADLT (ELECTROSURGICAL) ×1 IMPLANT
GAUZE PETRO XEROFOAM 1X8 (MISCELLANEOUS) IMPLANT
GAUZE SPONGE 4X4 12PLY STRL (GAUZE/BANDAGES/DRESSINGS) ×2 IMPLANT
GAUZE STRETCH 2X75IN STRL (MISCELLANEOUS) ×2 IMPLANT
GLOVE BIO SURGEON STRL SZ7.5 (GLOVE) ×6 IMPLANT
GLOVE INDICATOR 8.0 STRL GRN (GLOVE) ×6 IMPLANT
GOWN STRL REUS W/ TWL LRG LVL3 (GOWN DISPOSABLE) ×3 IMPLANT
GOWN STRL REUS W/TWL LRG LVL3 (GOWN DISPOSABLE) ×3
KIT RM TURNOVER STRD PROC AR (KITS) ×2 IMPLANT
LABEL OR SOLS (LABEL) ×2 IMPLANT
NS IRRIG 500ML POUR BTL (IV SOLUTION) ×2 IMPLANT
PACK EXTREMITY ARMC (MISCELLANEOUS) ×2 IMPLANT
PAD PREP 24X41 OB/GYN DISP (PERSONAL CARE ITEMS) ×2 IMPLANT
PLATE LOCK 7H 92 BILAT FIB (Plate) ×2 IMPLANT
SCREW LOCK CORT STAR 3.5X10 (Screw) ×4 IMPLANT
SCREW LOCK CORT STAR 3.5X12 (Screw) ×4 IMPLANT
SCREW LOCK CORT STAR 3.5X14 (Screw) ×2 IMPLANT
SCREW LOW PROFILE 18MMX3.5MM (Screw) ×2 IMPLANT
SCREW NON LOCKING LP 3.5 14MM (Screw) ×2 IMPLANT
SPLINT CAST 1 STEP 4X30 (MISCELLANEOUS) IMPLANT
SPLINT FAST PLASTER 5X30 (CAST SUPPLIES)
SPLINT PLASTER CAST FAST 5X30 (CAST SUPPLIES) IMPLANT
SPONGE LAP 18X18 5 PK (GAUZE/BANDAGES/DRESSINGS) ×2 IMPLANT
STAPLER SKIN PROX 35W (STAPLE) ×2 IMPLANT
STOCKINETTE M/LG 89821 (MISCELLANEOUS) ×2 IMPLANT
STRAP SAFETY BODY (MISCELLANEOUS) ×2 IMPLANT
STRIP CLOSURE SKIN 1/2X4 (GAUZE/BANDAGES/DRESSINGS) IMPLANT
SUT ETHILON 2 0 FS 18 (SUTURE) ×4 IMPLANT
SUT VIC AB 2-0 CT1 27 (SUTURE) ×1
SUT VIC AB 2-0 CT1 TAPERPNT 27 (SUTURE) ×1 IMPLANT
SUT VIC AB 3-0 SH 27 (SUTURE) ×1
SUT VIC AB 3-0 SH 27X BRD (SUTURE) ×1 IMPLANT
SWABSTK COMLB BENZOIN TINCTURE (MISCELLANEOUS) ×2 IMPLANT
SYRINGE 10CC LL (SYRINGE) ×2 IMPLANT

## 2017-01-23 NOTE — Discharge Instructions (Addendum)
Please call and make an Appointment to be seen by Cardiology at [facility]. (Dr. Saralyn Pilar or Dr. Clayborn Bigness)..........Marland Kitchen Reason: EKG Changes.     [place] [facility] [place]   1. Take your medication as prescribed.  Pain medication should be taken only as needed.  2. Keep the dressing clean, dry and intact.  3. Keep your foot elevated above the heart level for the first 48 hours.  4. Walking to the bathroom and brief periods of walking are acceptable, unless we have instructed you to be non-weight bearing.  5. Always wear your post-op shoe when walking.  Always use your crutches if you are to be non-weight bearing.  6. Do not take a shower. Baths are permissible as long as the foot is kept out of the water.   7. Every hour you are awake:  - Bend your knee 15 times.  8. Call [facility] [phone]) if any of the following problems occur: - You develop a temperature or fever. - The bandage becomes saturated with blood. - Medication does not stop your pain. - Injury of the foot occurs. - Any symptoms of infection including redness, odor, or red streaks running from wound.    AMBULATORY SURGERY  DISCHARGE INSTRUCTIONS   1) The drugs that you were given will stay in your system until tomorrow so for the next 24 hours you should not:  A) Drive an automobile B) Make any legal decisions C) Drink any alcoholic beverage   2) You may resume regular meals tomorrow.  Today it is better to start with liquids and gradually work up to solid foods.  You may eat anything you prefer, but it is better to start with liquids, then soup and crackers, and gradually work up to solid foods.   3) Please notify your doctor immediately if you have any unusual bleeding, trouble breathing, redness and pain at the surgery site, drainage, fever, or pain not relieved  by medication.    4) Additional Instructions:        Please contact your physician with any problems or Same Day Surgery at [phone], Monday through Friday 6 am to 4 pm, or [place] at [facility] number at [phone].

## 2017-01-23 NOTE — Anesthesia Procedure Notes (Signed)
Procedure Name: LMA Insertion Date/Time: 01/23/2017 1:49 PM Performed by: Leander Rams Pre-anesthesia Checklist: Patient identified, Emergency Drugs available, Suction available, Patient being monitored and Timeout performed Patient Re-evaluated:Patient Re-evaluated prior to inductionOxygen Delivery Method: Circle system utilized Preoxygenation: Pre-oxygenation with 100% oxygen Intubation Type: IV induction LMA: LMA inserted LMA Size: 4.0 Tube secured with: Tape

## 2017-01-23 NOTE — Anesthesia Procedure Notes (Addendum)
Anesthesia Regional Block: Popliteal block   Pre-Anesthetic Checklist: ,, timeout performed, Correct Patient, Correct Site, Correct Laterality, Correct Procedure, Correct Position, site marked, Risks and benefits discussed,  Surgical consent,  Pre-op evaluation,  At surgeon's request and post-op pain management  Laterality: Lower and Right  Prep: chloraprep       Needles:  Injection technique: Single-shot  Needle Type: Echogenic Needle     Needle Length: 9cm  Needle Gauge: 21     Additional Needles:   Procedures: ultrasound guided,,,,,,,,  Narrative:  Start time: 01/23/2017 12:11 PM End time: 01/23/2017 12:17 PM Injection made incrementally with aspirations every 5 mL.  Performed by: Personally  Anesthesiologist: Katy Fitch K  Additional Notes: Patient endorses baseline weakness and numbness in right foot  Functioning IV was confirmed and monitors were applied.  A echogenic needle was used. Sterile prep,hand hygiene and sterile gloves were used. Minimal sedation used for procedure.   No paresthesia endorsed by patient during the procedure.  Negative aspiration and negative test dose prior to incremental administration of local anesthetic. The patient tolerated the procedure well with no immediate complications.

## 2017-01-23 NOTE — Op Note (Signed)
Operative note   Surgeon:Alexes Menchaca    Assistant:none    Preop diagnosis: Right distal fibular fracture    Postop diagnosis: Same    Procedure: Open reduction with internal fixation right distal fibular fracture    EBL: Minimal    Anesthesia:regional and general    Hemostasis: Thigh tourniquet inflated to 250 mmHg for 60 minutes    Specimen: None    Complications: None    Operative indications:Courtney Oneill is an 61 y.o. that presents today for surgical intervention.  The risks/benefits/alternatives/complications have been discussed and consent has been given.    Procedure:  Patient was brought into the OR and placed on the operating table in thesupine position. After anesthesia was obtained theright lower extremity was prepped and draped in usual sterile fashion.  After inflation of the tourniquet attention was directed to the lateral aspect of the ankle where a longitudinal incision was made overlying the distal fibula. Sharp and blunt dissection carried down to the periosteum. Subperiosteal dissection was then undertaken. There was noted be a posterior proximally displaced distal fibular short oblique fracture. The wound was then irrigated. Bone reduction clamps were used to reduce the fracture site. Good alignment and stability was noted. At this time a lateral locking Biomet distal fibular plate was used. A 7 hole plate with 3 holes distal and 3 holes proximal placed for stabilization with 3.5 mm screws. Good alignment and stability was noted in all planes. The wound was then flushed with copious amounts or irrigation. Layered closure was performed with a 3-0 Vicryl for the deep and subcutaneous tissue and a 2-0 nylon for skin. Patient was placed in a well compressive sterile dressing. Placed in a equalizer walker boot.    Patient tolerated the procedure and anesthesia well.  Was transported from the OR to the PACU with all vital signs stable and vascular status intact. To be  discharged per routine protocol.  Will follow up in approximately 1 week in the outpatient clinic. Prescription for Percocet was dispensed prior to discharge.

## 2017-01-23 NOTE — H&P (Signed)
HISTORY AND PHYSICAL INTERVAL NOTE:  01/23/2017  1:21 PM  Courtney Oneill  has presented today for surgery, with the diagnosis of Right Ankle Fracture.  The various methods of treatment have been discussed with the patient.  No guarantees were given.  After consideration of risks, benefits and other options for treatment, the patient has consented to surgery.  I have reviewed the patients' chart and labs.    Patient Vitals for the past 24 hrs:  BP Temp Temp src Pulse Resp SpO2 Height Weight  01/23/17 1308 135/79 - - 68 11 100 % - -  01/23/17 1303 (!) 138/91 - - 73 (!) 7 100 % - -  01/23/17 1258 135/87 - - 74 10 100 % - -  01/23/17 1253 140/88 - - 68 16 100 % - -  01/23/17 1248 - - - 83 (!) 22 100 % - -  01/23/17 1243 140/87 - - 74 10 100 % - -  01/23/17 1238 (!) 147/84 - - 72 11 100 % - -  01/23/17 1233 (!) 146/88 - - 69 (!) 9 100 % - -  01/23/17 1228 (!) 151/85 - - 77 (!) 8 100 % - -  01/23/17 1223 (!) 152/86 - - 68 10 100 % - -  01/23/17 1218 (!) 147/101 - - 81 14 100 % - -  01/23/17 1213 (!) 144/88 - - 72 11 100 % - -  01/23/17 1208 (!) 147/89 - - 80 13 100 % - -  01/23/17 1144 (!) 149/89 98.2 F (36.8 C) Oral 87 16 100 % 5' 6.5" (1.689 m) 65.3 kg (144 lb)    A history and physical examination was performed in my office.  The patient was reexamined.  There have been no changes to this history and physical examination.  Samara Deist A

## 2017-01-23 NOTE — Anesthesia Post-op Follow-up Note (Cosign Needed)
Anesthesia QCDR form completed.        

## 2017-01-23 NOTE — Progress Notes (Signed)
Spoke to Dr. Clayborn Bigness via telephone about pt EKG changes (Left Bundle Branch Block). He wants pt to call and have a follow-Up appointment in the office of with her Cardiologist (Dr. Saralyn Pilar). Pt is alert, calm, and talkative and has no s/s of distress. VS and orders assessed and Dr. Kayleen Memos is following pt as well. Will continue to monitor and tx pt according to Md orders.

## 2017-01-23 NOTE — Anesthesia Preprocedure Evaluation (Signed)
Anesthesia Evaluation  Patient identified by MRN, date of birth, ID band Patient awake    Reviewed: Allergy & Precautions, H&P , NPO status , Patient's Chart, lab work & pertinent test results  History of Anesthesia Complications Negative for: history of anesthetic complications  Airway Mallampati: III  TM Distance: <3 FB Neck ROM: limited    Dental  (+) Chipped, Caps   Pulmonary neg shortness of breath, asthma , former smoker,           Cardiovascular Exercise Tolerance: Good hypertension, (-) angina(-) Past MI      Neuro/Psych PSYCHIATRIC DISORDERS Anxiety Depression negative neurological ROS     GI/Hepatic Neg liver ROS, GERD  Controlled,  Endo/Other  negative endocrine ROS  Renal/GU      Musculoskeletal   Abdominal   Peds  Hematology negative hematology ROS (+)   Anesthesia Other Findings Patient endorses baseline numbness and weakness in right leg.  Past Medical History: No date: Allergic state No date: Anxiety No date: Asthma without status asthmaticus No date: Depression No date: Gastritis No date: GERD (gastroesophageal reflux disease) No date: Hypertension No date: IBS (irritable bowel syndrome) No date: Insomnia No date: Venous insufficiency  Past Surgical History: No date: CESAREAN SECTION No date: CHOLECYSTECTOMY 08/26/2016: COLONOSCOPY WITH PROPOFOL N/A     Comment: Procedure: COLONOSCOPY WITH PROPOFOL;                Surgeon: Lollie Sails, MD;  Location: [facility]              ENDOSCOPY;  Service: Endoscopy;  Laterality:               N/A; No date: FUNCTIONAL ENDOSCOPIC SINUS SURGERY  BMI    Body Mass Index:  22.89 kg/m      Reproductive/Obstetrics negative OB ROS                             Anesthesia Physical Anesthesia Plan  ASA: III  Anesthesia Plan: General   Post-op Pain Management:  Regional for Post-op pain   Induction: Intravenous  PONV  Risk Score and Plan: 3 and Ondansetron, Dexamethasone, Propofol and Midazolam  Airway Management Planned: LMA  Additional Equipment:   Intra-op Plan:   Post-operative Plan: Extubation in OR  Informed Consent: I have reviewed the patients History and Physical, chart, labs and discussed the procedure including the risks, benefits and alternatives for the proposed anesthesia with the patient or authorized representative who has indicated his/her understanding and acceptance.   Dental Advisory Given  Plan Discussed with: Anesthesiologist, CRNA and Surgeon  Anesthesia Plan Comments: (Patient consented for risks of anesthesia including but not limited to:  - adverse reactions to medications - damage to teeth, lips or other oral mucosa - sore throat or hoarseness - Damage to heart, brain, lungs or loss of life  Patient voiced understanding.)        Anesthesia Quick Evaluation

## 2017-01-23 NOTE — Transfer of Care (Signed)
Immediate Anesthesia Transfer of Care Note  Patient: Courtney Oneill  Procedure(s) Performed: Procedure(s): OPEN REDUCTION INTERNAL FIXATION (ORIF) ANKLE FRACTURE - FIBULAR  (Right)  Patient Location: PACU  Anesthesia Type:General  Level of Consciousness: awake  Airway & Oxygen Therapy: Patient Spontanous Breathing and Patient connected to nasal cannula oxygen  Post-op Assessment: Report given to RN and Post -op Vital signs reviewed and stable  Post vital signs: Reviewed and stable  Last Vitals:  Vitals:   01/23/17 1303 01/23/17 1308  BP: (!) 138/91 135/79  Pulse: 73 68  Resp: (!) 7 11  Temp:      Last Pain:  Vitals:   01/23/17 1144  TempSrc: Oral  PainSc: 4          Complications: No apparent anesthesia complications

## 2017-01-24 ENCOUNTER — Encounter: Payer: Self-pay | Admitting: Podiatry

## 2017-01-25 NOTE — Anesthesia Postprocedure Evaluation (Signed)
Anesthesia Post Note  Patient: Courtney Oneill  Procedure(s) Performed: Procedure(s) (LRB): OPEN REDUCTION INTERNAL FIXATION (ORIF) ANKLE FRACTURE - FIBULAR  (Right)  Patient location during evaluation: PACU Anesthesia Type: General Level of consciousness: awake and alert Pain management: pain level controlled Vital Signs Assessment: post-procedure vital signs reviewed and stable Respiratory status: spontaneous breathing, nonlabored ventilation, respiratory function stable and patient connected to nasal cannula oxygen Cardiovascular status: blood pressure returned to baseline and stable Postop Assessment: no signs of nausea or vomiting Anesthetic complications: no     Last Vitals:  Vitals:   01/23/17 1630 01/23/17 1645  BP: (!) 147/79 (!) 144/81  Pulse: 95 91  Resp: 18 18  Temp: 36.4 C     Last Pain:  Vitals:   01/23/17 1630  TempSrc:   PainSc: 2                  Molli Barrows

## 2017-04-15 DIAGNOSIS — R002 Palpitations: Secondary | ICD-10-CM

## 2017-04-15 DIAGNOSIS — R0602 Shortness of breath: Secondary | ICD-10-CM | POA: Insufficient documentation

## 2017-04-15 DIAGNOSIS — I447 Left bundle-branch block, unspecified: Secondary | ICD-10-CM

## 2017-04-15 DIAGNOSIS — I10 Essential (primary) hypertension: Secondary | ICD-10-CM

## 2017-04-15 HISTORY — DX: Left bundle-branch block, unspecified: I44.7

## 2017-04-15 HISTORY — DX: Essential (primary) hypertension: I10

## 2017-04-15 HISTORY — DX: Shortness of breath: R06.02

## 2017-04-15 HISTORY — DX: Palpitations: R00.2

## 2017-04-29 DIAGNOSIS — I420 Dilated cardiomyopathy: Secondary | ICD-10-CM

## 2017-04-29 HISTORY — DX: Dilated cardiomyopathy: I42.0

## 2017-05-25 ENCOUNTER — Ambulatory Visit
Admission: RE | Admit: 2017-05-25 | Discharge: 2017-05-25 | Disposition: A | Discharge status: Home / Self Care | Payer: Managed Care, Other (non HMO) | Source: Ambulatory Visit | Attending: Internal Medicine | Admitting: Internal Medicine

## 2017-05-25 DIAGNOSIS — Z1231 Encounter for screening mammogram for malignant neoplasm of breast: Secondary | ICD-10-CM | POA: Diagnosis not present

## 2017-05-25 DIAGNOSIS — Z1239 Encounter for other screening for malignant neoplasm of breast: Secondary | ICD-10-CM

## 2018-02-16 ENCOUNTER — Other Ambulatory Visit: Payer: Self-pay | Admitting: Internal Medicine

## 2018-02-16 DIAGNOSIS — G4452 New daily persistent headache (NDPH): Secondary | ICD-10-CM

## 2018-02-17 ENCOUNTER — Other Ambulatory Visit: Payer: Self-pay | Admitting: Internal Medicine

## 2018-02-17 DIAGNOSIS — G4452 New daily persistent headache (NDPH): Secondary | ICD-10-CM

## 2018-02-18 ENCOUNTER — Other Ambulatory Visit: Payer: Self-pay | Admitting: Internal Medicine

## 2018-02-18 DIAGNOSIS — R1084 Generalized abdominal pain: Secondary | ICD-10-CM

## 2018-02-18 DIAGNOSIS — R100 Acute abdomen: Secondary | ICD-10-CM

## 2018-03-02 ENCOUNTER — Encounter: Payer: Self-pay | Admitting: Radiology

## 2018-03-02 ENCOUNTER — Ambulatory Visit
Admission: RE | Admit: 2018-03-02 | Discharge: 2018-03-02 | Disposition: A | Discharge status: Home / Self Care | Payer: 59 | Source: Ambulatory Visit | Attending: Internal Medicine | Admitting: Internal Medicine

## 2018-03-02 DIAGNOSIS — R100 Acute abdomen: Secondary | ICD-10-CM

## 2018-03-02 DIAGNOSIS — R1084 Generalized abdominal pain: Secondary | ICD-10-CM

## 2018-03-02 DIAGNOSIS — G4452 New daily persistent headache (NDPH): Secondary | ICD-10-CM

## 2018-03-02 MED ORDER — IOPAMIDOL (ISOVUE-300) INJECTION 61%
100.0000 mL | Freq: Once | INTRAVENOUS | Status: AC | PRN
  Administered 2018-03-02: 100 mL via INTRAVENOUS

## 2018-03-05 ENCOUNTER — Other Ambulatory Visit: Payer: Self-pay | Admitting: Internal Medicine

## 2018-03-05 ENCOUNTER — Other Ambulatory Visit: Payer: Managed Care, Other (non HMO)

## 2018-03-05 DIAGNOSIS — N83202 Unspecified ovarian cyst, left side: Secondary | ICD-10-CM

## 2018-03-09 ENCOUNTER — Ambulatory Visit
Admission: RE | Admit: 2018-03-09 | Discharge: 2018-03-09 | Disposition: A | Discharge status: Home / Self Care | Payer: 59 | Source: Ambulatory Visit | Attending: Internal Medicine | Admitting: Internal Medicine

## 2018-03-09 DIAGNOSIS — N83202 Unspecified ovarian cyst, left side: Secondary | ICD-10-CM | POA: Insufficient documentation

## 2018-03-09 DIAGNOSIS — D250 Submucous leiomyoma of uterus: Secondary | ICD-10-CM | POA: Diagnosis not present

## 2018-03-22 ENCOUNTER — Other Ambulatory Visit: Payer: Self-pay | Admitting: Gastroenterology

## 2018-03-22 DIAGNOSIS — R1032 Left lower quadrant pain: Secondary | ICD-10-CM

## 2018-03-22 DIAGNOSIS — R1013 Epigastric pain: Secondary | ICD-10-CM

## 2018-03-22 DIAGNOSIS — R1012 Left upper quadrant pain: Secondary | ICD-10-CM

## 2018-03-22 DIAGNOSIS — K529 Noninfective gastroenteritis and colitis, unspecified: Secondary | ICD-10-CM

## 2018-03-23 ENCOUNTER — Other Ambulatory Visit
Admission: RE | Admit: 2018-03-23 | Discharge: 2018-03-23 | Disposition: A | Discharge status: Home / Self Care | Payer: 59 | Source: Ambulatory Visit | Attending: Gastroenterology | Admitting: Gastroenterology

## 2018-03-23 DIAGNOSIS — R1013 Epigastric pain: Secondary | ICD-10-CM | POA: Diagnosis not present

## 2018-03-23 DIAGNOSIS — K529 Noninfective gastroenteritis and colitis, unspecified: Secondary | ICD-10-CM | POA: Insufficient documentation

## 2018-03-23 DIAGNOSIS — R1032 Left lower quadrant pain: Secondary | ICD-10-CM | POA: Insufficient documentation

## 2018-03-23 DIAGNOSIS — R1012 Left upper quadrant pain: Secondary | ICD-10-CM | POA: Insufficient documentation

## 2018-03-23 LAB — GASTROINTESTINAL PANEL BY PCR, STOOL (REPLACES STOOL CULTURE)
Adenovirus F40/41: NOT DETECTED
Astrovirus: NOT DETECTED
Campylobacter species: NOT DETECTED
Cryptosporidium: NOT DETECTED
Cyclospora cayetanensis: NOT DETECTED
ENTAMOEBA HISTOLYTICA: NOT DETECTED
Enteroaggregative E coli (EAEC): NOT DETECTED
Enteropathogenic E coli (EPEC): NOT DETECTED
Enterotoxigenic E coli (ETEC): NOT DETECTED
Giardia lamblia: NOT DETECTED
NOROVIRUS GI/GII: NOT DETECTED
PLESIMONAS SHIGELLOIDES: NOT DETECTED
Rotavirus A: NOT DETECTED
SAPOVIRUS (I, II, IV, AND V): NOT DETECTED
SHIGA LIKE TOXIN PRODUCING E COLI (STEC): NOT DETECTED
SHIGELLA/ENTEROINVASIVE E COLI (EIEC): NOT DETECTED
Salmonella species: NOT DETECTED
VIBRIO CHOLERAE: NOT DETECTED
Vibrio species: NOT DETECTED
Yersinia enterocolitica: NOT DETECTED

## 2018-03-23 LAB — C DIFFICILE QUICK SCREEN W PCR REFLEX
C DIFFICILE (CDIFF) TOXIN: NEGATIVE
C Diff antigen: NEGATIVE
C Diff interpretation: NOT DETECTED

## 2018-03-24 ENCOUNTER — Ambulatory Visit
Admission: RE | Admit: 2018-03-24 | Discharge: 2018-03-24 | Disposition: A | Discharge status: Home / Self Care | Payer: 59 | Source: Ambulatory Visit | Attending: Gastroenterology | Admitting: Gastroenterology

## 2018-03-24 DIAGNOSIS — K449 Diaphragmatic hernia without obstruction or gangrene: Secondary | ICD-10-CM | POA: Insufficient documentation

## 2018-03-24 DIAGNOSIS — R1012 Left upper quadrant pain: Secondary | ICD-10-CM | POA: Diagnosis present

## 2018-03-24 DIAGNOSIS — R1032 Left lower quadrant pain: Secondary | ICD-10-CM | POA: Insufficient documentation

## 2018-03-24 DIAGNOSIS — R1013 Epigastric pain: Secondary | ICD-10-CM | POA: Diagnosis not present

## 2018-03-24 DIAGNOSIS — K529 Noninfective gastroenteritis and colitis, unspecified: Secondary | ICD-10-CM | POA: Insufficient documentation

## 2018-03-25 LAB — CALPROTECTIN, FECAL: Calprotectin, Fecal: <16 ug/g (ref 0–120)

## 2018-04-01 LAB — MISCELLANEOUS TEST

## 2018-05-04 ENCOUNTER — Other Ambulatory Visit: Payer: Self-pay | Admitting: Internal Medicine

## 2018-05-04 DIAGNOSIS — Z1231 Encounter for screening mammogram for malignant neoplasm of breast: Secondary | ICD-10-CM

## 2018-06-01 ENCOUNTER — Ambulatory Visit
Admission: RE | Admit: 2018-06-01 | Discharge: 2018-06-01 | Disposition: A | Discharge status: Home / Self Care | Payer: 59 | Source: Ambulatory Visit | Attending: Internal Medicine | Admitting: Internal Medicine

## 2018-06-01 DIAGNOSIS — Z1231 Encounter for screening mammogram for malignant neoplasm of breast: Secondary | ICD-10-CM

## 2018-09-07 ENCOUNTER — Other Ambulatory Visit
Admission: RE | Admit: 2018-09-07 | Discharge: 2018-09-07 | Disposition: A | Discharge status: Home / Self Care | Payer: 59 | Source: Ambulatory Visit | Attending: Physician Assistant | Admitting: Physician Assistant

## 2018-09-07 DIAGNOSIS — R0789 Other chest pain: Secondary | ICD-10-CM | POA: Diagnosis not present

## 2018-09-07 LAB — TROPONIN I

## 2018-09-23 DIAGNOSIS — R079 Chest pain, unspecified: Secondary | ICD-10-CM

## 2018-09-23 HISTORY — DX: Chest pain, unspecified: R07.9

## 2018-09-30 DIAGNOSIS — R200 Anesthesia of skin: Secondary | ICD-10-CM

## 2018-09-30 HISTORY — DX: Anesthesia of skin: R20.0

## 2018-10-05 ENCOUNTER — Encounter: Payer: Self-pay | Admitting: *Deleted

## 2018-10-05 ENCOUNTER — Other Ambulatory Visit: Payer: Self-pay

## 2018-10-05 ENCOUNTER — Encounter
Admission: RE | Disposition: A | Discharge status: Home / Self Care | Payer: Self-pay | Source: Home / Self Care | Attending: Cardiology

## 2018-10-05 ENCOUNTER — Ambulatory Visit
Admission: RE | Admit: 2018-10-05 | Discharge: 2018-10-05 | Disposition: A | Discharge status: Home / Self Care | Payer: 59 | Attending: Cardiology | Admitting: Cardiology

## 2018-10-05 DIAGNOSIS — J45909 Unspecified asthma, uncomplicated: Secondary | ICD-10-CM | POA: Insufficient documentation

## 2018-10-05 DIAGNOSIS — I10 Essential (primary) hypertension: Secondary | ICD-10-CM | POA: Diagnosis not present

## 2018-10-05 DIAGNOSIS — I447 Left bundle-branch block, unspecified: Secondary | ICD-10-CM | POA: Diagnosis not present

## 2018-10-05 DIAGNOSIS — R9439 Abnormal result of other cardiovascular function study: Secondary | ICD-10-CM | POA: Diagnosis not present

## 2018-10-05 DIAGNOSIS — Z79899 Other long term (current) drug therapy: Secondary | ICD-10-CM | POA: Insufficient documentation

## 2018-10-05 DIAGNOSIS — Z87891 Personal history of nicotine dependence: Secondary | ICD-10-CM | POA: Diagnosis not present

## 2018-10-05 DIAGNOSIS — Z9889 Other specified postprocedural states: Secondary | ICD-10-CM

## 2018-10-05 DIAGNOSIS — F329 Major depressive disorder, single episode, unspecified: Secondary | ICD-10-CM | POA: Diagnosis not present

## 2018-10-05 DIAGNOSIS — I420 Dilated cardiomyopathy: Secondary | ICD-10-CM | POA: Insufficient documentation

## 2018-10-05 DIAGNOSIS — F419 Anxiety disorder, unspecified: Secondary | ICD-10-CM | POA: Insufficient documentation

## 2018-10-05 DIAGNOSIS — K219 Gastro-esophageal reflux disease without esophagitis: Secondary | ICD-10-CM | POA: Insufficient documentation

## 2018-10-05 HISTORY — PX: LEFT HEART CATH AND CORONARY ANGIOGRAPHY: CATH118249

## 2018-10-05 HISTORY — DX: Other specified postprocedural states: Z98.890

## 2018-10-05 SURGERY — LEFT HEART CATH AND CORONARY ANGIOGRAPHY
Anesthesia: Moderate Sedation | Laterality: Right

## 2018-10-05 MED ORDER — HEPARIN SODIUM (PORCINE) 1000 UNIT/ML IJ SOLN
INTRAMUSCULAR | Status: DC | PRN
  Administered 2018-10-05: 3500 [IU] via INTRAVENOUS

## 2018-10-05 MED ORDER — ASPIRIN 81 MG PO CHEW
CHEWABLE_TABLET | ORAL | Status: AC
  Administered 2018-10-05: 81 mg via ORAL
  Filled 2018-10-05: qty 1

## 2018-10-05 MED ORDER — SODIUM CHLORIDE 0.9 % IV SOLN: 250.0000 mL | INTRAVENOUS | Status: DC | PRN

## 2018-10-05 MED ORDER — SODIUM CHLORIDE 0.9% FLUSH: 3.0000 mL | Freq: Two times a day (BID) | INTRAVENOUS | Status: DC

## 2018-10-05 MED ORDER — MIDAZOLAM HCL 2 MG/2ML IJ SOLN
INTRAMUSCULAR | Status: DC | PRN
  Administered 2018-10-05: 1 mg via INTRAVENOUS

## 2018-10-05 MED ORDER — SODIUM CHLORIDE 0.9 % WEIGHT BASED INFUSION: 1.0000 mL/kg/h | INTRAVENOUS | Status: DC

## 2018-10-05 MED ORDER — VERAPAMIL HCL 2.5 MG/ML IV SOLN
INTRAVENOUS | Status: DC | PRN
  Administered 2018-10-05: 2.5 mg via INTRA_ARTERIAL

## 2018-10-05 MED ORDER — HEPARIN SODIUM (PORCINE) 1000 UNIT/ML IJ SOLN
INTRAMUSCULAR | Status: AC
  Filled 2018-10-05: qty 1

## 2018-10-05 MED ORDER — FENTANYL CITRATE (PF) 100 MCG/2ML IJ SOLN
INTRAMUSCULAR | Status: DC | PRN
  Administered 2018-10-05: 25 ug via INTRAVENOUS

## 2018-10-05 MED ORDER — IOPAMIDOL (ISOVUE-300) INJECTION 61%
INTRAVENOUS | Status: DC | PRN
  Administered 2018-10-05: 85 mL via INTRA_ARTERIAL

## 2018-10-05 MED ORDER — HEPARIN (PORCINE) IN NACL 1000-0.9 UT/500ML-% IV SOLN
INTRAVENOUS | Status: AC
  Filled 2018-10-05: qty 1000

## 2018-10-05 MED ORDER — SODIUM CHLORIDE 0.9% FLUSH: 3.0000 mL | INTRAVENOUS | Status: DC | PRN

## 2018-10-05 MED ORDER — VERAPAMIL HCL 2.5 MG/ML IV SOLN
INTRAVENOUS | Status: AC
  Filled 2018-10-05: qty 2

## 2018-10-05 MED ORDER — FENTANYL CITRATE (PF) 100 MCG/2ML IJ SOLN
INTRAMUSCULAR | Status: AC
  Filled 2018-10-05: qty 2

## 2018-10-05 MED ORDER — SODIUM CHLORIDE 0.9 % WEIGHT BASED INFUSION
3.0000 mL/kg/h | INTRAVENOUS | Status: AC
  Administered 2018-10-05: 3 mL/kg/h via INTRAVENOUS

## 2018-10-05 MED ORDER — ASPIRIN 81 MG PO CHEW
81.0000 mg | CHEWABLE_TABLET | ORAL | Status: AC
  Administered 2018-10-05: 81 mg via ORAL

## 2018-10-05 MED ORDER — HEPARIN (PORCINE) IN NACL 1000-0.9 UT/500ML-% IV SOLN
INTRAVENOUS | Status: DC | PRN
  Administered 2018-10-05: 500 mL

## 2018-10-05 MED ORDER — ONDANSETRON HCL 4 MG/2ML IJ SOLN: 4.0000 mg | Freq: Four times a day (QID) | INTRAMUSCULAR | Status: DC | PRN

## 2018-10-05 MED ORDER — MIDAZOLAM HCL 2 MG/2ML IJ SOLN
INTRAMUSCULAR | Status: AC
  Filled 2018-10-05: qty 2

## 2018-10-05 MED ORDER — ACETAMINOPHEN 325 MG PO TABS: 650.0000 mg | ORAL_TABLET | ORAL | Status: DC | PRN

## 2018-10-05 SURGICAL SUPPLY — 8 items
CATH 5F 110X4 TIG (CATHETERS) ×3 IMPLANT
CATH INFINITI 5FR ANG PIGTAIL (CATHETERS) ×3 IMPLANT
DEVICE RAD TR BAND REGULAR (VASCULAR PRODUCTS) ×3 IMPLANT
GLIDESHEATH SLEND SS 6F .021 (SHEATH) ×3 IMPLANT
KIT MANI 3VAL PERCEP (MISCELLANEOUS) ×3 IMPLANT
PACK CARDIAC CATH (CUSTOM PROCEDURE TRAY) ×3 IMPLANT
WIRE HITORQ VERSACORE ST 145CM (WIRE) ×3 IMPLANT
WIRE ROSEN-J .035X260CM (WIRE) ×6 IMPLANT

## 2019-03-21 ENCOUNTER — Telehealth: Payer: Self-pay | Admitting: Urology

## 2019-03-21 ENCOUNTER — Other Ambulatory Visit: Payer: Self-pay

## 2019-03-21 ENCOUNTER — Encounter: Payer: Self-pay | Admitting: Urology

## 2019-03-21 ENCOUNTER — Ambulatory Visit: Payer: 59 | Admitting: Urology

## 2019-03-21 VITALS — BP 99/66 | HR 84 | Ht 66.0 in | Wt 150.0 lb

## 2019-03-21 DIAGNOSIS — R339 Retention of urine, unspecified: Secondary | ICD-10-CM

## 2019-03-21 DIAGNOSIS — N390 Urinary tract infection, site not specified: Secondary | ICD-10-CM

## 2019-03-21 DIAGNOSIS — B3731 Acute candidiasis of vulva and vagina: Secondary | ICD-10-CM

## 2019-03-21 DIAGNOSIS — B373 Candidiasis of vulva and vagina: Secondary | ICD-10-CM

## 2019-03-21 DIAGNOSIS — N83209 Unspecified ovarian cyst, unspecified side: Secondary | ICD-10-CM

## 2019-03-21 HISTORY — DX: Unspecified ovarian cyst, unspecified side: N83.209

## 2019-03-21 LAB — BLADDER SCAN AMB NON-IMAGING

## 2019-03-21 MED ORDER — NITROFURANTOIN MONOHYD MACRO 100 MG PO CAPS: 100.0000 mg | ORAL_CAPSULE | Freq: Two times a day (BID) | ORAL | 0 refills | Status: AC

## 2019-03-21 NOTE — Telephone Encounter (Signed)
Pt called and would like an RX for Diflucan called in, she states that she always gets yeast infections while taking antibiotics.

## 2019-03-21 NOTE — Progress Notes (Signed)
03/21/2019 9:05 AM   [name] 1956/04/13 [id number]  Referring provider: Idelle Crouch, MD [facility] [place],  [place] [address]  Chief Complaint  Patient presents with  . Recurrent UTI    New patient    HPI: Courtney Oneill is a 63 y.o. female seen in consultation at the request of Dr. Doy Hutching for evaluation of recurrent UTIs.  She has been treated for E. coli infections in April, June and July 2020.  Her symptoms include onset of dysuria, frequency, low back pain and sensation of incomplete emptying.  She has urinary hesitancy and dribbling.  Her symptoms have resolved with antibiotics however return after several weeks.  She is presently asymptomatic.  She previously saw Dr. Jacqlyn Larsen at his [facility] practice for recurrent UTIs and was on low-dose antibiotic prophylaxis at one time.  She thinks she had a cystoscopy which was unremarkable.  She denies bulging or tissue protruding per vagina.  Denies fever, chills or gross hematuria.   PMH: Past Medical History:  Diagnosis Date  . Allergic state   . Anxiety   . Asthma 12/13/2013  . Asthma without status asthmaticus   . Cardiomyopathy, dilated ([place]) 04/29/2017  . Chest pain with high risk for cardiac etiology 09/23/2018  . Cyst of ovary 03/21/2019  . Depression   . Essential hypertension 04/15/2017  . Facial numbness 09/30/2018  . Gastritis   . GERD (gastroesophageal reflux disease)   . Heart palpitations 04/15/2017  . Hyperlipidemia 12/13/2013  . Hypertension   . IBS (irritable bowel syndrome)   . Incomplete emptying of bladder 12/16/2012  . Insomnia   . Kidney stone 12/16/2012  . LBBB (left bundle branch block) 04/15/2017  . Raynaud's disease without gangrene 07/05/2015  . SOB (shortness of breath) on exertion 04/15/2017  . Venous insufficiency     Surgical History: Past Surgical History:  Procedure Laterality Date  . CESAREAN SECTION    . CHOLECYSTECTOMY    . COLONOSCOPY WITH PROPOFOL N/A  08/26/2016   Procedure: COLONOSCOPY WITH PROPOFOL;  Surgeon: Lollie Sails, MD;  Location: [facility];  Service: Endoscopy;  Laterality: N/A;  . FUNCTIONAL ENDOSCOPIC SINUS SURGERY    . LEFT HEART CATH AND CORONARY ANGIOGRAPHY Right 10/05/2018   Procedure: LEFT HEART CATH AND CORONARY ANGIOGRAPHY;  Surgeon: Isaias Cowman, MD;  Location: [facility];  Service: Cardiovascular;  Laterality: Right;  . ORIF ANKLE FRACTURE Right 01/23/2017   Procedure: OPEN REDUCTION INTERNAL FIXATION (ORIF) ANKLE FRACTURE - FIBULAR ;  Surgeon: Samara Deist, DPM;  Location: [facility] ORS;  Service: Podiatry;  Laterality: Right;    Home Medications:  Allergies as of 03/21/2019      Reactions   Sulfa Antibiotics Swelling   Swelling in mouth      Medication List       Accurate as of March 21, 2019  9:05 AM. If you have any questions, ask your nurse or doctor.        acetaminophen 500 MG tablet Commonly known as: TYLENOL Take 1,000 mg by mouth every 6 (six) hours as needed for moderate pain or headache.   amLODipine 5 MG tablet Commonly known as: NORVASC Take 5 mg by mouth daily.   azelastine 0.05 % ophthalmic solution Commonly known as: OPTIVAR Place 1 drop into both eyes 2 (two) times daily as needed (allergies).   carvedilol 12.5 MG tablet Commonly known as: COREG Take 12.5 mg by mouth 2 (two) times daily with a meal.  cholecalciferol 25 MCG (1000 UT) tablet Commonly known as: VITAMIN D3 Take 1,000 Units by mouth every other day.   conjugated estrogens vaginal cream Commonly known as: PREMARIN Place 1 Applicatorful vaginally daily as needed (irritation).   Flovent HFA 110 MCG/ACT inhaler Generic drug: fluticasone Inhale 2 puffs into the lungs 2 (two) times daily.   ibuprofen 200 MG tablet Commonly known as: ADVIL Take 400 mg by mouth every 6 (six) hours as needed for headache or moderate pain.   montelukast 10 MG tablet Commonly known as: SINGULAIR Take 10 mg by  mouth at bedtime.   omeprazole 40 MG capsule Commonly known as: PRILOSEC Take 40 mg by mouth daily.   phenylephrine 10 MG Tabs tablet Commonly known as: SUDAFED PE Take 10 mg by mouth every 6 (six) hours as needed (allergies).   polyethylene glycol powder 17 GM/SCOOP powder Commonly known as: GLYCOLAX/MIRALAX Take 17 g by mouth daily as needed for moderate constipation.   albuterol (2.5 MG/3ML) 0.083% nebulizer solution Commonly known as: PROVENTIL Take 2.5 mg by nebulization every 6 (six) hours as needed for wheezing or shortness of breath.   ProAir HFA 108 (90 Base) MCG/ACT inhaler Generic drug: albuterol Inhale 1-2 puffs into the lungs every 4 (four) hours as needed for wheezing or shortness of breath.   Probiotic Caps Take 1 capsule by mouth daily.   zolpidem 12.5 MG CR tablet Commonly known as: AMBIEN CR Take 12.5 mg by mouth at bedtime.       Allergies:  Allergies  Allergen Reactions  . Sulfa Antibiotics Swelling    Swelling in mouth     Family History: Family History  Problem Relation Age of Onset  . Pancreatic cancer Mother 49  . Breast cancer Maternal Aunt     Social History:  reports that she has quit smoking. She has never used smokeless tobacco. She reports current alcohol use. She reports that she does not use drugs.  ROS: UROLOGY Frequent Urination?: Yes Hard to postpone urination?: No Burning/pain with urination?: Yes Get up at night to urinate?: Yes Leakage of urine?: No Urine stream starts and stops?: Yes Trouble starting stream?: No Do you have to strain to urinate?: No Blood in urine?: No Urinary tract infection?: Yes Sexually transmitted disease?: No Injury to kidneys or bladder?: No Painful intercourse?: Yes Weak stream?: Yes Currently pregnant?: No Vaginal bleeding?: No Last menstrual period?: n  Gastrointestinal Nausea?: No Vomiting?: No Indigestion/heartburn?: Yes Diarrhea?: Yes Constipation?: Yes  Constitutional  Fever: No Night sweats?: No Weight loss?: No Fatigue?: Yes  Skin Skin rash/lesions?: No Itching?: No  Eyes Blurred vision?: No Double vision?: No  Ears/Nose/Throat Sore throat?: No Sinus problems?: No  Hematologic/Lymphatic Swollen glands?: No Easy bruising?: No  Cardiovascular Leg swelling?: No Chest pain?: No  Respiratory Cough?: No Shortness of breath?: No  Endocrine Excessive thirst?: No  Musculoskeletal Back pain?: Yes Joint pain?: Yes  Neurological Headaches?: No Dizziness?: No  Psychologic Depression?: No Anxiety?: Yes  Physical Exam: BP 99/66   Pulse 84   Ht 5\' 6"  (1.676 m)   Wt 150 lb (68 kg)   BMI 24.21 kg/m   Constitutional:  Alert and oriented, No acute distress. HEENT: [place] AT, moist mucus membranes.  Trachea midline, no masses. Cardiovascular: No clubbing, cyanosis, or edema. Respiratory: Normal respiratory effort, no increased work of breathing. GI: Abdomen is soft, nontender, nondistended, no abdominal masses GU: No CVA tenderness Lymph: No cervical or inguinal lymphadenopathy. Skin: No rashes, bruises or suspicious lesions. Neurologic: Grossly  intact, no focal deficits, moving all 4 extremities. Psychiatric: Normal mood and affect.  Laboratory Data:  Urinalysis Dipstick trace blood, 1+ leukocytes, nitrite positive Microscopy 11-30 WBC  Assessment & Plan:    - Recurrent UTI Urinalysis today does show pyuria.  PVR by bladder scan was 192 mL.  Her urine was cultured and will go ahead and start on antibiotics pending the culture result.  I recommended scheduling a renal ultrasound and cystoscopy for further evaluation.  Pelvic exam to be performed at time of cystoscopy.   Abbie Sons, [place] [address], [place], [place] [address] [phone]

## 2019-03-22 LAB — URINALYSIS, COMPLETE
Bilirubin, UA: NEGATIVE
Glucose, UA: NEGATIVE
Ketones, UA: NEGATIVE
Nitrite, UA: POSITIVE — AB
Protein,UA: NEGATIVE
Specific Gravity, UA: 1.01 (ref 1.005–1.030)
Urobilinogen, Ur: 0.2 mg/dL (ref 0.2–1.0)
pH, UA: 6 (ref 5.0–7.5)

## 2019-03-22 LAB — MICROSCOPIC EXAMINATION: RBC, Urine: NONE SEEN /hpf (ref 0–2)

## 2019-03-22 MED ORDER — FLUCONAZOLE 150 MG PO TABS: 150.0000 mg | ORAL_TABLET | Freq: Once | ORAL | 1 refills | Status: AC

## 2019-03-22 NOTE — Telephone Encounter (Signed)
Rx sent. Pt informed via mychart.

## 2019-03-23 LAB — CULTURE, URINE COMPREHENSIVE

## 2019-03-24 ENCOUNTER — Telehealth: Payer: Self-pay

## 2019-03-24 NOTE — Telephone Encounter (Signed)
-----   Message from Abbie Sons, MD sent at 03/24/2019  7:10 AM EDT ----- Urine culture was positive and sensitive to prescribed antibiotic

## 2019-03-24 NOTE — Telephone Encounter (Signed)
Left pt mess to call no DPR on file, mychart notification sent

## 2019-04-12 ENCOUNTER — Other Ambulatory Visit: Payer: Self-pay

## 2019-04-12 ENCOUNTER — Ambulatory Visit
Admission: RE | Admit: 2019-04-12 | Discharge: 2019-04-12 | Disposition: A | Discharge status: Home / Self Care | Payer: BLUE CROSS/BLUE SHIELD | Source: Ambulatory Visit | Attending: Urology | Admitting: Urology

## 2019-04-12 DIAGNOSIS — N390 Urinary tract infection, site not specified: Secondary | ICD-10-CM

## 2019-04-14 ENCOUNTER — Ambulatory Visit: Payer: BLUE CROSS/BLUE SHIELD | Admitting: Urology

## 2019-04-14 ENCOUNTER — Other Ambulatory Visit: Payer: Self-pay

## 2019-04-14 ENCOUNTER — Encounter: Payer: Self-pay | Admitting: Urology

## 2019-04-14 VITALS — BP 120/73 | HR 71 | Ht 66.0 in | Wt 150.0 lb

## 2019-04-14 DIAGNOSIS — N390 Urinary tract infection, site not specified: Secondary | ICD-10-CM

## 2019-04-14 DIAGNOSIS — R339 Retention of urine, unspecified: Secondary | ICD-10-CM

## 2019-04-14 LAB — URINALYSIS, COMPLETE
Bilirubin, UA: NEGATIVE
Glucose, UA: NEGATIVE
Ketones, UA: NEGATIVE
Leukocytes,UA: NEGATIVE
Nitrite, UA: NEGATIVE
Protein,UA: NEGATIVE
Specific Gravity, UA: 1.015 (ref 1.005–1.030)
Urobilinogen, Ur: 0.2 mg/dL (ref 0.2–1.0)
pH, UA: 6 (ref 5.0–7.5)

## 2019-04-14 LAB — MICROSCOPIC EXAMINATION

## 2019-04-14 MED ORDER — LIDOCAINE HCL URETHRAL/MUCOSAL 2 % EX GEL
1.0000 "application " | Freq: Once | CUTANEOUS | Status: AC
  Administered 2019-04-14: 1 via URETHRAL

## 2019-04-14 NOTE — Patient Instructions (Signed)
Cranberry chew tabs, tablets, or capsules What is this medicine? Cranberry [place] beri) is a dietary supplement. It is promoted to maintain urinary tract health. The FDA has not approved this supplement for any medical use. This supplement may be used for other purposes; ask your health care provider or pharmacist if you have questions. This medicine may be used for other purposes; ask your health care provider or pharmacist if you have questions. COMMON BRAND NAME(S): AZO Cranberry, Ellura, TheraCran, TheraCran HP, TheraCran HP for Kids, TheraCran One What should I tell my health care provider before I take this medicine? They need to know if you have any of these conditions:  diabetes  kidney stones  lung or breathing disease, like asthma  stomach or intestine problems  an unusual or allergic reaction to cranberry, other herbs or plants, aspirin, other medicines, foods, dyes, or preservatives  pregnant or trying to get pregnant  breast-feeding How should I use this medicine? Take this supplement by mouth with a glass of water. Some tablets may be chewed before swallowing. Follow the directions on the package labeling or take as directed by your health care professional. Do not take this supplement more often than directed. Contact your pediatrician or health care professional regarding the use of this supplement in children. Special care may be needed. Overdosage: If you think you have taken too much of this medicine contact a poison control center or emergency room at once. NOTE: This medicine is only for you. Do not share this medicine with others. What if I miss a dose? If you miss a dose, take it as soon as you can. If it is almost time for your next dose, take only that dose. Do not take double or extra doses. What may interact with this medicine?  warfarin This list may not describe all possible interactions. Give your health care provider a list of all the medicines, herbs,  non-prescription drugs, or dietary supplements you use. Also tell them if you smoke, drink alcohol, or use illegal drugs. Some items may interact with your medicine. What should I watch for while using this medicine? Herbal or dietary supplements are not regulated like medicines. Rigid quality control standards are not required for dietary supplements. The purity and strength of these products can vary. The safety and effect of this dietary supplement for a certain disease or illness is not well known. This product is not intended to diagnose, treat, cure or prevent any disease. The Food and Drug Administration suggests the following to help consumers protect themselves:  Always read product labels and follow directions.  Natural does not mean a product is safe for humans to take.  Look for products that include USP after the ingredient name. This means that the manufacturer followed the standards of the [place] Pharmacopoeia.  Supplements made or sold by a nationally known food or drug company are more likely to be made under tight controls. You can write to the company for more information about how the product was made. What side effects may I notice from receiving this medicine? Side effects that you should report to your doctor or health care professional as soon as possible:  allergic reactions like skin rash, itching or hives, swelling of the face, lips, or tongue  blood in the urine  pain in the lower back or side  pain when urinating Side effects that usually do not require medical attention (report to your doctor or health care professional if they continue or are bothersome):  changes in taste  mild stomach upset This list may not describe all possible side effects. Call your doctor for medical advice about side effects. You may report side effects to FDA at 1-800-FDA-1088. Where should I keep my medicine? Keep out of the reach of children. Store as directed on the package label.  Throw away any unused supplement after the expiration date. NOTE: This sheet is a summary. It may not cover all possible information. If you have questions about this medicine, talk to your doctor, pharmacist, or health care provider.  2020 Elsevier/Gold Standard (2015-08-16 11:26:13)    D-Mannose is the secondary supplement Dr.Stoioff recommends

## 2019-04-14 NOTE — Progress Notes (Signed)
   04/14/19  CC:  Chief Complaint  Patient presents with  . Cysto    HPI: 63 y.o. female recently seen for recurrent UTIs.  Refer to my office note of 03/21/2019.  Blood pressure 120/73, pulse 71, height 5\' 6"  (1.676 m), weight 150 lb (68 kg). NED. A&Ox3.   No respiratory distress   Abd soft, NT, ND Normal external genitalia with patent urethral meatus  Imaging: Renal ultrasound performed 04/12/2019 showed a 3 mm nonobstructing left renal calculus.  No other significant findings noted.   Cystoscopy Procedure Note  Patient identification was confirmed, informed consent was obtained, and patient was prepped using Betadine solution.  Lidocaine jelly was administered per urethral meatus.    Procedure: - Flexible cystoscope introduced, without any difficulty.   - Thorough search of the bladder revealed:    normal urethral meatus    normal urothelium    no stones    no ulcers     no tumors    no urethral polyps    no trabeculation  - Ureteral orifices were normal in position and appearance.  Post-Procedure: - Patient tolerated the procedure well  Assessment/ Plan:  - Recurrent UTIs No significant abnormalities on upper tract imaging and cystoscopy.  We discussed prevention strategies including cranberry, d-mannose, low-dose vaginal estrogen and low-dose antibiotic prophylaxis.  Her symptoms are stable at this point.  She will start on cranberry supplements and D-mannose.  Follow-up 3-4 months  Abbie Sons, MD

## 2019-04-15 ENCOUNTER — Encounter: Payer: Self-pay | Admitting: Urology

## 2019-05-16 ENCOUNTER — Other Ambulatory Visit: Payer: Self-pay | Admitting: Internal Medicine

## 2019-05-16 DIAGNOSIS — Z1231 Encounter for screening mammogram for malignant neoplasm of breast: Secondary | ICD-10-CM

## 2019-05-19 ENCOUNTER — Ambulatory Visit: Payer: BLUE CROSS/BLUE SHIELD

## 2019-05-19 ENCOUNTER — Ambulatory Visit: Payer: BLUE CROSS/BLUE SHIELD | Admitting: Physician Assistant

## 2019-05-19 ENCOUNTER — Other Ambulatory Visit: Payer: Self-pay

## 2019-05-19 ENCOUNTER — Encounter: Payer: Self-pay | Admitting: Physician Assistant

## 2019-05-19 VITALS — BP 102/64 | HR 92 | Ht 66.0 in | Wt 152.0 lb

## 2019-05-19 DIAGNOSIS — R300 Dysuria: Secondary | ICD-10-CM | POA: Diagnosis not present

## 2019-05-19 DIAGNOSIS — R339 Retention of urine, unspecified: Secondary | ICD-10-CM | POA: Diagnosis not present

## 2019-05-19 LAB — BLADDER SCAN AMB NON-IMAGING: Scan Result: 32 mL

## 2019-05-19 MED ORDER — TAMSULOSIN HCL 0.4 MG PO CAPS: 0.4000 mg | ORAL_CAPSULE | Freq: Every day | ORAL | 0 refills | Status: DC

## 2019-05-19 NOTE — Progress Notes (Signed)
05/19/2019 12:05 PM   Courtney Oneill [birth year] [id number]  CC: Dysuria, low back pain, pressure  HPI: Courtney Oneill is a 63 y.o. female who presents today for evaluation of possible UTI. She is an established [facility] patient who last saw Dr. Bernardo Heater on 04/14/2019 for cystoscopy with PMH rUTI, no abnormalities visualized. She was started on cranberry supplements and D-mannose for UTI prevention at that time.  She reports a 2-day history of dysuria, low back pain, and suprapubic pressure. She denies fevers, chills, nausea, vomiting, flank pain, and gross hematuria.  She reports having stopped cranberry supplementation due to inadequate therapeutic benefit and D-mannose due to acid reflux. She continues to take a daily probiotic.  She reports chronic constipation associated with IBS as well as lumbar back pain.  In-office UA today positive for 1+ blood; urine microscopy pan-negative. PVR 331mL. She was prompted to void again; subsequent PVR 44mL.  PMH: Past Medical History:  Diagnosis Date  . Allergic state   . Anxiety   . Asthma 12/13/2013  . Asthma without status asthmaticus   . Cardiomyopathy, dilated ([place]) 04/29/2017  . Chest pain with high risk for cardiac etiology 09/23/2018  . Cyst of ovary 03/21/2019  . Depression   . Essential hypertension 04/15/2017  . Facial numbness 09/30/2018  . Gastritis   . GERD (gastroesophageal reflux disease)   . Heart palpitations 04/15/2017  . Hyperlipidemia 12/13/2013  . Hypertension   . IBS (irritable bowel syndrome)   . Incomplete emptying of bladder 12/16/2012  . Insomnia   . Kidney stone 12/16/2012  . LBBB (left bundle branch block) 04/15/2017  . Raynaud's disease without gangrene 07/05/2015  . SOB (shortness of breath) on exertion 04/15/2017  . Venous insufficiency     Surgical History: Past Surgical History:  Procedure Laterality Date  . CESAREAN SECTION    . CHOLECYSTECTOMY    . COLONOSCOPY WITH PROPOFOL N/A 08/26/2016   Procedure:  COLONOSCOPY WITH PROPOFOL;  Surgeon: Lollie Sails, MD;  Location: [facility] ENDOSCOPY;  Service: Endoscopy;  Laterality: N/A;  . FUNCTIONAL ENDOSCOPIC SINUS SURGERY    . LEFT HEART CATH AND CORONARY ANGIOGRAPHY Right 10/05/2018   Procedure: LEFT HEART CATH AND CORONARY ANGIOGRAPHY;  Surgeon: Isaias Cowman, MD;  Location: [facility];  Service: Cardiovascular;  Laterality: Right;  . ORIF ANKLE FRACTURE Right 01/23/2017   Procedure: OPEN REDUCTION INTERNAL FIXATION (ORIF) ANKLE FRACTURE - FIBULAR ;  Surgeon: Samara Deist, DPM;  Location: [facility] ORS;  Service: Podiatry;  Laterality: Right;    Home Medications:  Allergies as of 05/19/2019      Reactions   Sulfa Antibiotics Swelling   Swelling in mouth      Medication List       Accurate as of May 19, 2019 11:59 PM. If you have any questions, ask your nurse or doctor.        acetaminophen 500 MG tablet Commonly known as: TYLENOL Take 1,000 mg by mouth every 6 (six) hours as needed for moderate pain or headache.   amLODipine 5 MG tablet Commonly known as: NORVASC Take 5 mg by mouth daily.   azelastine 0.05 % ophthalmic solution Commonly known as: OPTIVAR Place 1 drop into both eyes 2 (two) times daily as needed (allergies).   carvedilol 12.5 MG tablet Commonly known as: COREG Take 12.5 mg by mouth 2 (two) times daily with a meal.   conjugated estrogens vaginal cream Commonly known as: PREMARIN Place 1 Applicatorful vaginally daily as needed (irritation).  Flovent HFA 110 MCG/ACT inhaler Generic drug: fluticasone Inhale 2 puffs into the lungs 2 (two) times daily.   fluconazole 150 MG tablet Commonly known as: DIFLUCAN   hyoscyamine 0.125 MG SL tablet Commonly known as: LEVSIN SL   ibuprofen 200 MG tablet Commonly known as: ADVIL Take 400 mg by mouth every 6 (six) hours as needed for headache or moderate pain.   montelukast 10 MG tablet Commonly known as: SINGULAIR Take 10 mg by mouth at bedtime.    omeprazole 40 MG capsule Commonly known as: PRILOSEC Take 40 mg by mouth daily.   albuterol (2.5 MG/3ML) 0.083% nebulizer solution Commonly known as: PROVENTIL Take 2.5 mg by nebulization every 6 (six) hours as needed for wheezing or shortness of breath.   ProAir HFA 108 (90 Base) MCG/ACT inhaler Generic drug: albuterol Inhale 1-2 puffs into the lungs every 4 (four) hours as needed for wheezing or shortness of breath.   tamsulosin 0.4 MG Caps capsule Commonly known as: FLOMAX Take 1 capsule (0.4 mg total) by mouth daily. Started by: Debroah Loop, PA-C   zolpidem 12.5 MG CR tablet Commonly known as: AMBIEN CR Take 12.5 mg by mouth at bedtime.       Allergies:  Allergies  Allergen Reactions  . Sulfa Antibiotics Swelling    Swelling in mouth     Family History: Family History  Problem Relation Age of Onset  . Pancreatic cancer Mother 76  . Breast cancer Maternal Aunt     Social History:   reports that she has quit smoking. She has never used smokeless tobacco. She reports current alcohol use. She reports that she does not use drugs.  ROS: UROLOGY Frequent Urination?: Yes Hard to postpone urination?: No Burning/pain with urination?: Yes Get up at night to urinate?: Yes Leakage of urine?: No Urine stream starts and stops?: Yes Trouble starting stream?: Yes Do you have to strain to urinate?: No Blood in urine?: No Urinary tract infection?: Yes Sexually transmitted disease?: No Injury to kidneys or bladder?: No Painful intercourse?: No Weak stream?: No Currently pregnant?: No Vaginal bleeding?: No Last menstrual period?: n  Gastrointestinal Nausea?: No Vomiting?: No Indigestion/heartburn?: No Diarrhea?: No Constipation?: No  Constitutional Fever: No Night sweats?: No Weight loss?: No Fatigue?: No  Skin Skin rash/lesions?: No Itching?: No  Eyes Blurred vision?: No Double vision?: No  Ears/Nose/Throat Sore throat?: No Sinus  problems?: No  Hematologic/Lymphatic Swollen glands?: No Easy bruising?: No  Cardiovascular Leg swelling?: No Chest pain?: No  Respiratory Cough?: No Shortness of breath?: No  Endocrine Excessive thirst?: No  Musculoskeletal Back pain?: No Joint pain?: No  Neurological Headaches?: No Dizziness?: No  Psychologic Depression?: No Anxiety?: No  Physical Exam: BP 102/64   Pulse 92   Ht 5\' 6"  (1.676 m)   Wt 152 lb (68.9 kg)   BMI 24.53 kg/m   Constitutional:  Alert and oriented, no acute distress, nontoxic appearing HEENT: [place], AT Cardiovascular: No clubbing, cyanosis, or edema Respiratory: Normal respiratory effort, no increased work of breathing Skin: No rashes, bruises or suspicious lesions Neurologic: Grossly intact, no focal deficits, moving all 4 extremities Psychiatric: Normal mood and affect  Laboratory Data: Results for orders placed or performed in visit on 05/19/19  Microscopic Examination   URINE  Result Value Ref Range   WBC, UA 0-5 0 - 5 /hpf   RBC 0-2 0 - 2 /hpf   Epithelial Cells (non renal) 0-10 0 - 10 /hpf   Bacteria, UA None seen None seen/Few  Urinalysis, Complete  Result Value Ref Range   Specific Gravity, UA 1.010 1.005 - 1.030   pH, UA 6.0 5.0 - 7.5   Color, UA Yellow Yellow   Appearance Ur Clear Clear   Leukocytes,UA Negative Negative   Protein,UA Negative Negative/Trace   Glucose, UA Negative Negative   Ketones, UA Negative Negative   RBC, UA 1+ (A) Negative   Bilirubin, UA Negative Negative   Urobilinogen, Ur 0.2 0.2 - 1.0 mg/dL   Nitrite, UA Negative Negative   Microscopic Examination See below:   BLADDER SCAN AMB NON-IMAGING  Result Value Ref Range   Scan Result 363mL   BLADDER SCAN AMB NON-IMAGING  Result Value Ref Range   Scan Result 32 mL   Assessment & Plan:   1. Dysuria Patient with irritative voiding symptoms and UA reassuring for infection today.  No antibiotics at this time.  Will send for culture and follow-up  with patient if positive.    I counseled the patient on dietary modifications that can alleviate irritative bladder symptoms, including the avoidance of caffeine, alcohol, carbonated beverages, chocolate, spicy foods, and acidic foods including citrus and tomatoes. She expressed understanding. - CULTURE, URINE COMPREHENSIVE - Urinalysis, Complete  2. Incomplete bladder emptying Patient with elevated PVR upon presentation today.  She was able to empty appropriately upon prompting.  I like to initiate her on timed voiding today.  I advised her to select 2-3 times daily when she should empty her bladder regardless of sensation.  Starting her on a trial of tamsulosin today.  I want her to return to clinic in 1 to 2 weeks for follow-up PVR.  I counseled her that I suspect incomplete emptying may be playing a role in her recurrent UTIs.  If patient's symptoms continue or worsen, she may benefit from urodynamics or CIC in the future. - BLADDER SCAN AMB NON-IMAGING - tamsulosin (FLOMAX) 0.4 MG CAPS capsule; Take 1 capsule (0.4 mg total) by mouth daily.  Dispense: 30 capsule; Refill: 0 - BLADDER SCAN AMB NON-IMAGING  Return in about 2 weeks (around 06/02/2019) for PVR and Flomax follow-up.  Debroah Loop, PA-C  [facility] [address] [place], [place] [address] [phone]

## 2019-05-20 LAB — URINALYSIS, COMPLETE
Bilirubin, UA: NEGATIVE
Glucose, UA: NEGATIVE
Ketones, UA: NEGATIVE
Leukocytes,UA: NEGATIVE
Nitrite, UA: NEGATIVE
Protein,UA: NEGATIVE
Specific Gravity, UA: 1.01 (ref 1.005–1.030)
Urobilinogen, Ur: 0.2 mg/dL (ref 0.2–1.0)
pH, UA: 6 (ref 5.0–7.5)

## 2019-05-20 LAB — MICROSCOPIC EXAMINATION: Bacteria, UA: NONE SEEN

## 2019-05-25 ENCOUNTER — Telehealth: Payer: Self-pay | Admitting: Physician Assistant

## 2019-05-25 ENCOUNTER — Other Ambulatory Visit: Payer: Self-pay | Admitting: Physician Assistant

## 2019-05-25 DIAGNOSIS — N3000 Acute cystitis without hematuria: Secondary | ICD-10-CM

## 2019-05-25 LAB — CULTURE, URINE COMPREHENSIVE

## 2019-05-25 MED ORDER — NITROFURANTOIN MONOHYD MACRO 100 MG PO CAPS: 100.0000 mg | ORAL_CAPSULE | Freq: Two times a day (BID) | ORAL | 0 refills | Status: AC

## 2019-05-25 NOTE — Telephone Encounter (Signed)
Pt called back and I read here your note, she voiced understanding.

## 2019-05-25 NOTE — Telephone Encounter (Signed)
Attempted to contact patient to discuss results of her recent urine culture.  LMOM to return my call.  Macrobid 100 mg twice daily x5 days sent to her pharmacy.  I like her to start these as soon as possible.

## 2019-06-06 ENCOUNTER — Ambulatory Visit: Payer: BLUE CROSS/BLUE SHIELD | Admitting: Physician Assistant

## 2019-06-06 ENCOUNTER — Encounter: Payer: Self-pay | Admitting: Physician Assistant

## 2019-06-06 ENCOUNTER — Other Ambulatory Visit: Payer: Self-pay

## 2019-06-06 VITALS — BP 94/60 | HR 80 | Ht 66.0 in | Wt 153.2 lb

## 2019-06-06 DIAGNOSIS — R339 Retention of urine, unspecified: Secondary | ICD-10-CM | POA: Diagnosis not present

## 2019-06-06 DIAGNOSIS — R300 Dysuria: Secondary | ICD-10-CM

## 2019-06-06 LAB — URINALYSIS, COMPLETE
Bilirubin, UA: NEGATIVE
Glucose, UA: NEGATIVE
Ketones, UA: NEGATIVE
Leukocytes,UA: NEGATIVE
Nitrite, UA: NEGATIVE
Protein,UA: NEGATIVE
Specific Gravity, UA: 1.01 (ref 1.005–1.030)
Urobilinogen, Ur: 0.2 mg/dL (ref 0.2–1.0)
pH, UA: 6.5 (ref 5.0–7.5)

## 2019-06-06 LAB — MICROSCOPIC EXAMINATION
Bacteria, UA: NONE SEEN
WBC, UA: NONE SEEN /hpf (ref 0–5)

## 2019-06-06 LAB — BLADDER SCAN AMB NON-IMAGING: Scan Result: 149

## 2019-06-06 MED ORDER — TAMSULOSIN HCL 0.4 MG PO CAPS: 0.4000 mg | ORAL_CAPSULE | Freq: Every day | ORAL | 2 refills | Status: DC

## 2019-06-06 NOTE — Progress Notes (Signed)
06/06/2019 11:36 AM   [name] 09/30/1955 [id number]  CC: Flomax trial follow-up  HPI: Courtney SCHWARTING is a 63 y.o. female who presents today for follow-up on trial of Flomax. She is an established [facility] patient who last saw me on 05/19/2019 for acute dysuria and incomplete bladder emptying.  Urine culture positive for Staph lugdunensis; treated with Macrobid 100 mg twice daily x5 days.  I also started her on tamsulosin 0.4 mg daily at that time for treatment of incomplete bladder emptying with a history of recurrent UTI.  I also advised her to initiate timed voiding 2-3 times daily.  In the interim, she reports waxing and waning bladder symptoms. She completed her prescribed antibiotics and states some days she feels better, but she suspects she may still have an infection. She reports continued dysuria and frequency. She has not been performing timed voids but has been taking tamsulosin consistently and is tolerating it well without side effects.  She does have a history of recurrent UTI.  She takes a daily probiotic for prevention of these and previously stopped D-mannose and cranberry supplementation because she did not tolerate them well.  She does have a history of chronic constipation associated with IBS as well as lumbar back pain.  In-office UA today positive for trace-intact blood; urine microscopy pan-negative. PVR 172mL.  PMH: Past Medical History:  Diagnosis Date  . Allergic state   . Anxiety   . Asthma 12/13/2013  . Asthma without status asthmaticus   . Cardiomyopathy, dilated ([place]) 04/29/2017  . Chest pain with high risk for cardiac etiology 09/23/2018  . Cyst of ovary 03/21/2019  . Depression   . Essential hypertension 04/15/2017  . Facial numbness 09/30/2018  . Gastritis   . GERD (gastroesophageal reflux disease)   . Heart palpitations 04/15/2017  . Hyperlipidemia 12/13/2013  . Hypertension   . IBS (irritable bowel syndrome)   . Incomplete emptying of bladder 12/16/2012   . Insomnia   . Kidney stone 12/16/2012  . LBBB (left bundle branch block) 04/15/2017  . Raynaud's disease without gangrene 07/05/2015  . SOB (shortness of breath) on exertion 04/15/2017  . Venous insufficiency     Surgical History: Past Surgical History:  Procedure Laterality Date  . CESAREAN SECTION    . CHOLECYSTECTOMY    . COLONOSCOPY WITH PROPOFOL N/A 08/26/2016   Procedure: COLONOSCOPY WITH PROPOFOL;  Surgeon: Lollie Sails, MD;  Location: [facility] ENDOSCOPY;  Service: Endoscopy;  Laterality: N/A;  . FUNCTIONAL ENDOSCOPIC SINUS SURGERY    . LEFT HEART CATH AND CORONARY ANGIOGRAPHY Right 10/05/2018   Procedure: LEFT HEART CATH AND CORONARY ANGIOGRAPHY;  Surgeon: Isaias Cowman, MD;  Location: [place] [place] [facility];  Service: Cardiovascular;  Laterality: Right;  . ORIF ANKLE FRACTURE Right 01/23/2017   Procedure: OPEN REDUCTION INTERNAL FIXATION (ORIF) ANKLE FRACTURE - FIBULAR ;  Surgeon: Samara Deist, DPM;  Location: [facility] ORS;  Service: Podiatry;  Laterality: Right;    Home Medications:  Allergies as of 06/06/2019      Reactions   Sulfa Antibiotics Swelling   Swelling in mouth      Medication List       Accurate as of June 06, 2019 11:36 AM. If you have any questions, ask your nurse or doctor.        STOP taking these medications   acetaminophen 500 MG tablet Commonly known as: TYLENOL Stopped by: Debroah Loop, PA-C     TAKE these medications   amLODipine 5 MG tablet Commonly  known as: NORVASC Take 5 mg by mouth daily.   amphetamine-dextroamphetamine 5 MG tablet Commonly known as: ADDERALL Take by mouth.   azelastine 0.05 % ophthalmic solution Commonly known as: OPTIVAR Place 1 drop into both eyes 2 (two) times daily as needed (allergies).   carvedilol 12.5 MG tablet Commonly known as: COREG Take 12.5 mg by mouth 2 (two) times daily with a meal.   conjugated estrogens vaginal cream Commonly known as: PREMARIN Place 1 Applicatorful vaginally  daily as needed (irritation).   Flovent HFA 110 MCG/ACT inhaler Generic drug: fluticasone Inhale 2 puffs into the lungs 2 (two) times daily.   fluconazole 150 MG tablet Commonly known as: DIFLUCAN   hyoscyamine 0.125 MG SL tablet Commonly known as: LEVSIN SL   ibuprofen 200 MG tablet Commonly known as: ADVIL Take 400 mg by mouth every 6 (six) hours as needed for headache or moderate pain.   montelukast 10 MG tablet Commonly known as: SINGULAIR Take 10 mg by mouth at bedtime.   omeprazole 40 MG capsule Commonly known as: PRILOSEC Take 40 mg by mouth daily.   albuterol (2.5 MG/3ML) 0.083% nebulizer solution Commonly known as: PROVENTIL Take 2.5 mg by nebulization every 6 (six) hours as needed for wheezing or shortness of breath.   ProAir HFA 108 (90 Base) MCG/ACT inhaler Generic drug: albuterol Inhale 1-2 puffs into the lungs every 4 (four) hours as needed for wheezing or shortness of breath.   tamsulosin 0.4 MG Caps capsule Commonly known as: FLOMAX Take 1 capsule (0.4 mg total) by mouth daily.   zolpidem 12.5 MG CR tablet Commonly known as: AMBIEN CR Take 12.5 mg by mouth at bedtime.       Allergies:  Allergies  Allergen Reactions  . Sulfa Antibiotics Swelling    Swelling in mouth     Family History: Family History  Problem Relation Age of Onset  . Pancreatic cancer Mother 4  . Breast cancer Maternal Aunt     Social History:   reports that she has quit smoking. She has never used smokeless tobacco. She reports current alcohol use. She reports that she does not use drugs.  ROS: UROLOGY Frequent Urination?: No Hard to postpone urination?: No Burning/pain with urination?: Yes Get up at night to urinate?: Yes Leakage of urine?: No Urine stream starts and stops?: Yes Trouble starting stream?: No Do you have to strain to urinate?: No Blood in urine?: No Urinary tract infection?: No Sexually transmitted disease?: No Injury to kidneys or bladder?: No  Painful intercourse?: Yes Weak stream?: No Currently pregnant?: No Vaginal bleeding?: No Last menstrual period?: n  Gastrointestinal Nausea?: No Vomiting?: No Indigestion/heartburn?: No Diarrhea?: No Constipation?: No  Constitutional Fever: No Night sweats?: No Weight loss?: No Fatigue?: No  Skin Skin rash/lesions?: No Itching?: No  Eyes Blurred vision?: No Double vision?: No  Ears/Nose/Throat Sore throat?: No Sinus problems?: No  Hematologic/Lymphatic Swollen glands?: No Easy bruising?: No  Cardiovascular Leg swelling?: No Chest pain?: No  Respiratory Cough?: No Shortness of breath?: No  Endocrine Excessive thirst?: Yes  Musculoskeletal Back pain?: No Joint pain?: No  Neurological Headaches?: No Dizziness?: No  Psychologic Depression?: No Anxiety?: No  Physical Exam: BP 94/60   Pulse 80   Ht 5\' 6"  (1.676 m)   Wt 153 lb 3.2 oz (69.5 kg)   BMI 24.73 kg/m   Constitutional:  Alert and oriented, no acute distress, nontoxic appearing HEENT: [place], AT Cardiovascular: No clubbing, cyanosis, or edema Respiratory: Normal respiratory effort, no increased  work of breathing GI: Abdomen is soft, nontender, nondistended, no abdominal masses GU: No CVA tenderness Lymph: No cervical or inguinal lymphadenopathy Skin: No rashes, bruises or suspicious lesions Neurologic: Grossly intact, no focal deficits, moving all 4 extremities Psychiatric: Normal mood and affect  Laboratory Data: Results for orders placed or performed in visit on 06/06/19  Microscopic Examination   URINE  Result Value Ref Range   WBC, UA None seen 0 - 5 /hpf   RBC 0-2 0 - 2 /hpf   Epithelial Cells (non renal) 0-10 0 - 10 /hpf   Renal Epithel, UA 0-10 (A) None seen /hpf   Bacteria, UA None seen None seen/Few  Urinalysis, Complete  Result Value Ref Range   Specific Gravity, UA 1.010 1.005 - 1.030   pH, UA 6.5 5.0 - 7.5   Color, UA Yellow Yellow   Appearance Ur Clear Clear    Leukocytes,UA Negative Negative   Protein,UA Negative Negative/Trace   Glucose, UA Negative Negative   Ketones, UA Negative Negative   RBC, UA Trace (A) Negative   Bilirubin, UA Negative Negative   Urobilinogen, Ur 0.2 0.2 - 1.0 mg/dL   Nitrite, UA Negative Negative   Microscopic Examination See below:   Bladder Scan (Post Void Residual) in office  Result Value Ref Range   Scan Result 149    Assessment & Plan:   1. Incomplete bladder emptying PVR improved on trial of tamsulosin.  I encouraged patient to initiate timed voiding.  She expressed understanding.  She has a previously scheduled follow-up appointment with Dr. Bernardo Heater in 3 months.  Recommend PVR at that visit. - Bladder Scan (Post Void Residual) in office - tamsulosin (FLOMAX) 0.4 MG CAPS capsule; Take 1 capsule (0.4 mg total) by mouth daily.  Dispense: 30 capsule; Refill: 2  2. Dysuria Patient was appropriately treated for a recurrent UTI at her last visit.  UA today reassuring for persistent or recurrent infection.  Will not send for culture.  I suspect she may have an element of OAB versus IC contributing to her symptoms.  Can reevaluate this at her next follow-up. - Urinalysis, Complete  Return if symptoms worsen or fail to improve.  Debroah Loop, PA-C  [facility] [address], [place], [place] [address] [phone]

## 2019-06-07 ENCOUNTER — Telehealth: Payer: Self-pay | Admitting: Physician Assistant

## 2019-06-07 NOTE — Telephone Encounter (Signed)
Patient returned my call, I reported the results of her UA and counseled her to contact the office if her symptoms worsen or if she feels she is developing a new infection. She expressed understanding.

## 2019-06-07 NOTE — Telephone Encounter (Signed)
Contacted patient to inform her of the results of her urinalysis yesterday.  It was negative; I am not concerned for persistent or recurrent UTI at this time.  Am not sending for culture, as she was appropriately treated for infection at her last visit.  LMOM to return my call.

## 2019-06-21 ENCOUNTER — Ambulatory Visit
Admission: RE | Admit: 2019-06-21 | Discharge: 2019-06-21 | Disposition: A | Discharge status: Home / Self Care | Payer: BLUE CROSS/BLUE SHIELD | Source: Ambulatory Visit | Attending: Internal Medicine | Admitting: Internal Medicine

## 2019-06-21 DIAGNOSIS — Z1231 Encounter for screening mammogram for malignant neoplasm of breast: Secondary | ICD-10-CM | POA: Diagnosis not present

## 2019-08-11 ENCOUNTER — Other Ambulatory Visit: Payer: Self-pay | Admitting: Internal Medicine

## 2019-08-11 DIAGNOSIS — M5440 Lumbago with sciatica, unspecified side: Secondary | ICD-10-CM

## 2019-08-20 ENCOUNTER — Other Ambulatory Visit: Payer: Self-pay

## 2019-08-20 ENCOUNTER — Ambulatory Visit
Admission: RE | Admit: 2019-08-20 | Discharge: 2019-08-20 | Disposition: A | Discharge status: Home / Self Care | Payer: 59 | Source: Ambulatory Visit | Attending: Internal Medicine | Admitting: Internal Medicine

## 2019-08-20 DIAGNOSIS — M5440 Lumbago with sciatica, unspecified side: Secondary | ICD-10-CM

## 2019-09-05 ENCOUNTER — Ambulatory Visit: Payer: BLUE CROSS/BLUE SHIELD | Admitting: Urology

## 2019-09-06 ENCOUNTER — Ambulatory Visit: Payer: BLUE CROSS/BLUE SHIELD | Admitting: Physician Assistant

## 2019-09-29 ENCOUNTER — Ambulatory Visit: Payer: BLUE CROSS/BLUE SHIELD | Admitting: Urology

## 2020-01-24 IMAGING — MG DIGITAL SCREENING BILAT W/ TOMO W/ CAD
8 series · 8 of 24 positions shown · non-contrast
Comparison: Previous exam(s).

CLINICAL DATA: Screening.

EXAM:
DIGITAL SCREENING BILATERAL MAMMOGRAM WITH TOMO AND CAD

[R CC synth-2D]
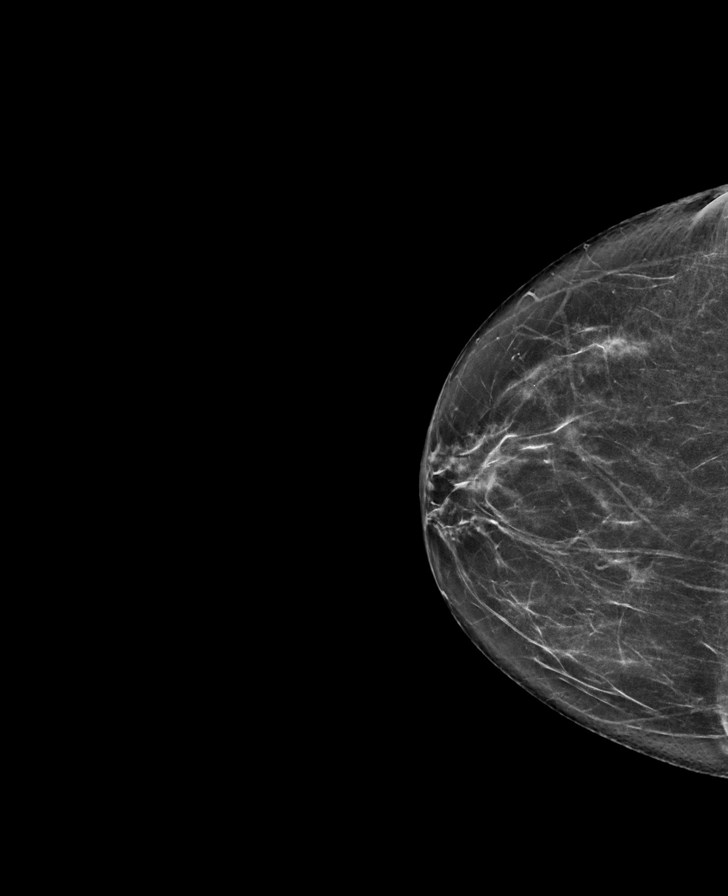

[L CC synth-2D]
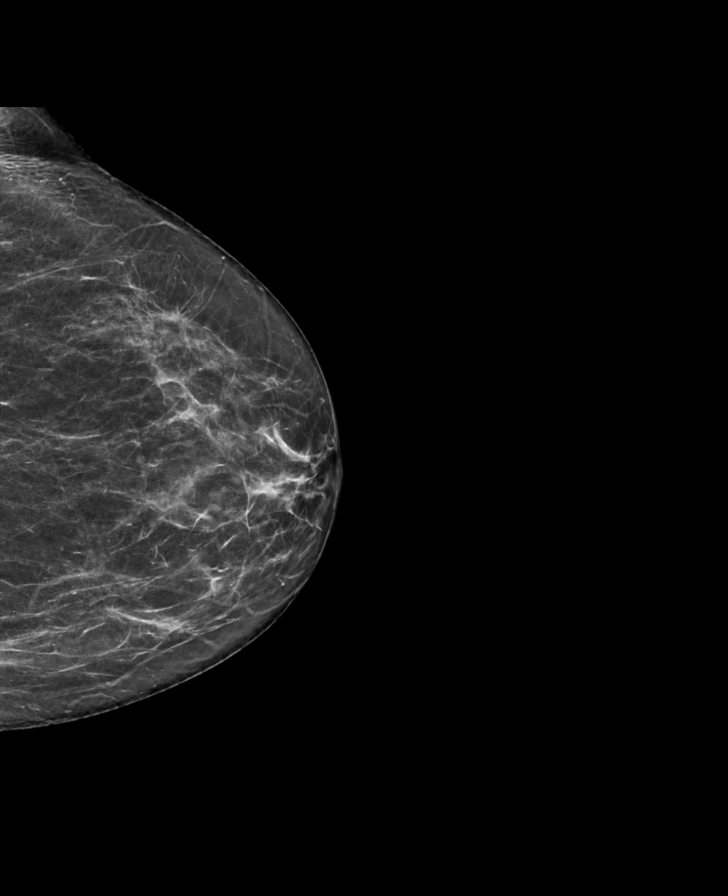

[L MLO synth-2D]
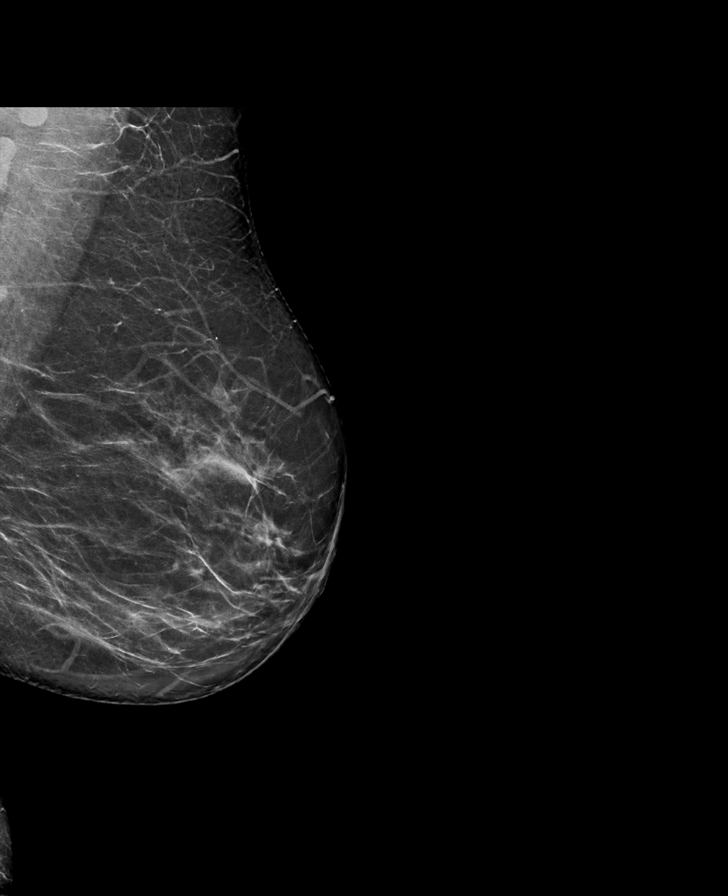

[R MLO synth-2D]
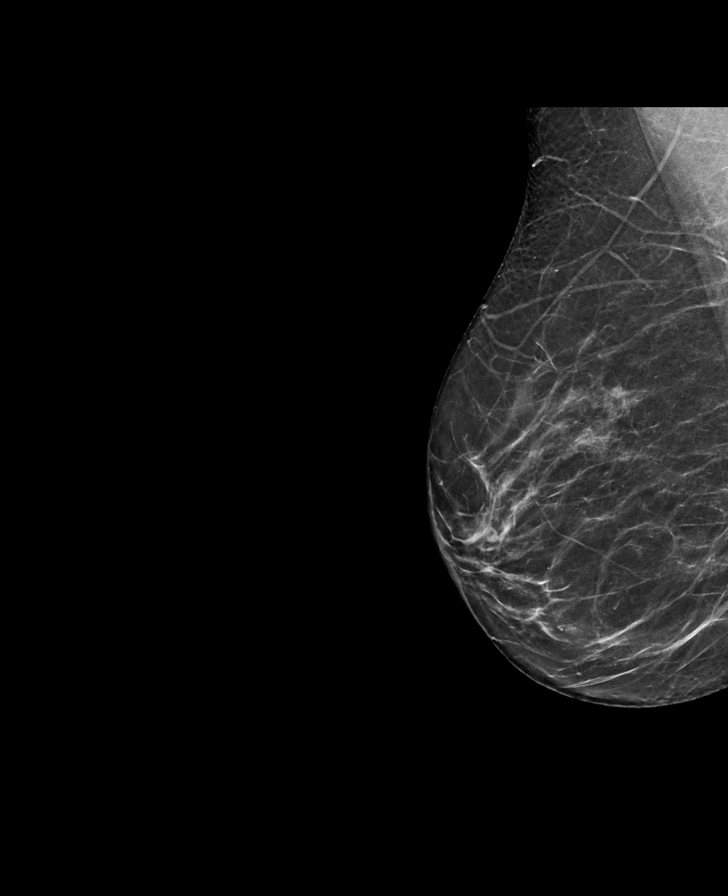

[L CC tomo · tomo slice 33/64.0]
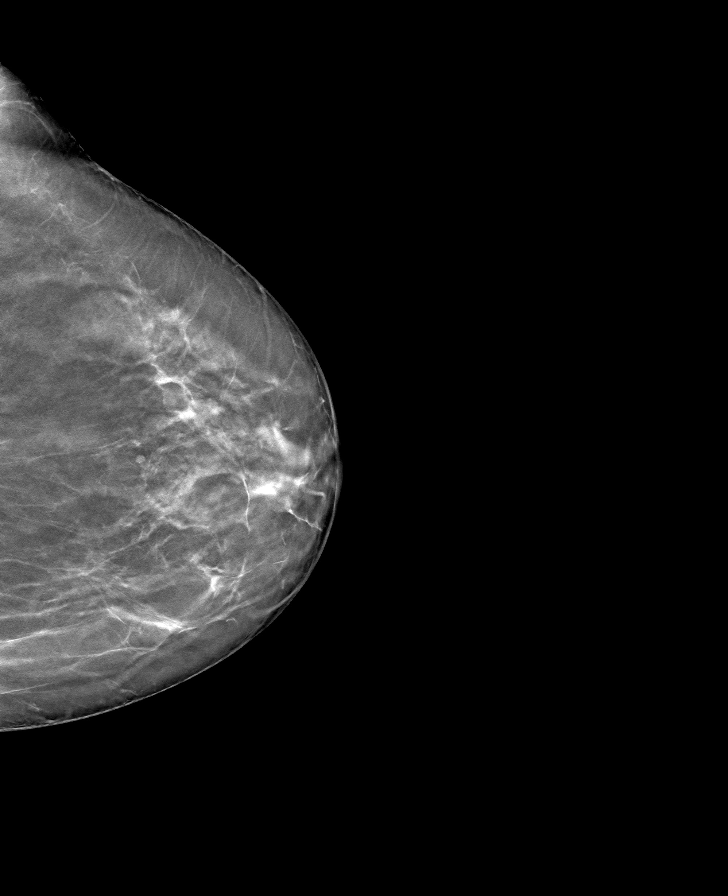

[R MLO tomo · tomo slice 33/65.0]
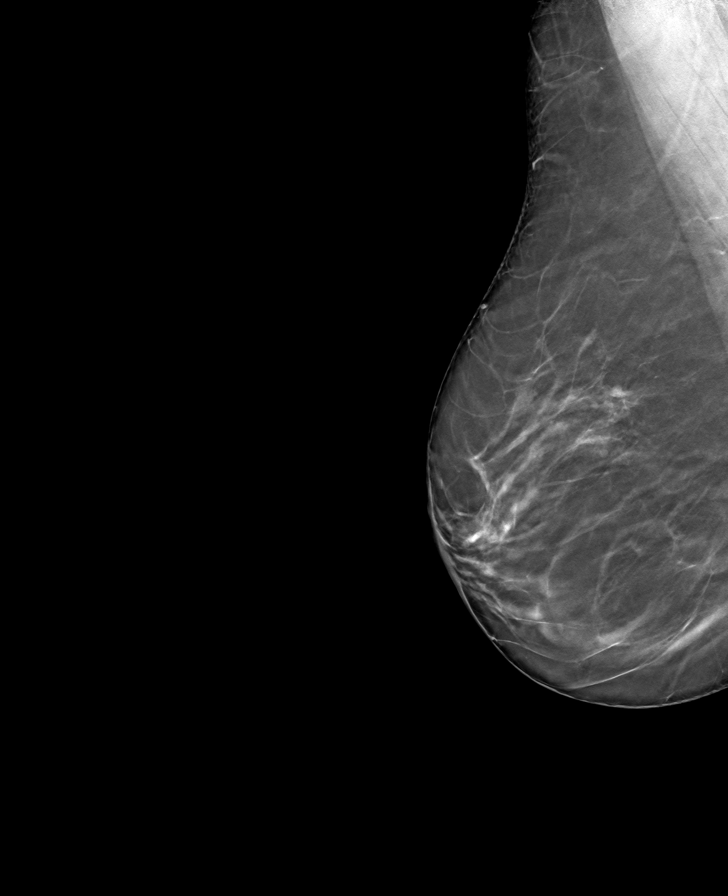

[R CC tomo · tomo slice 33/66.0]
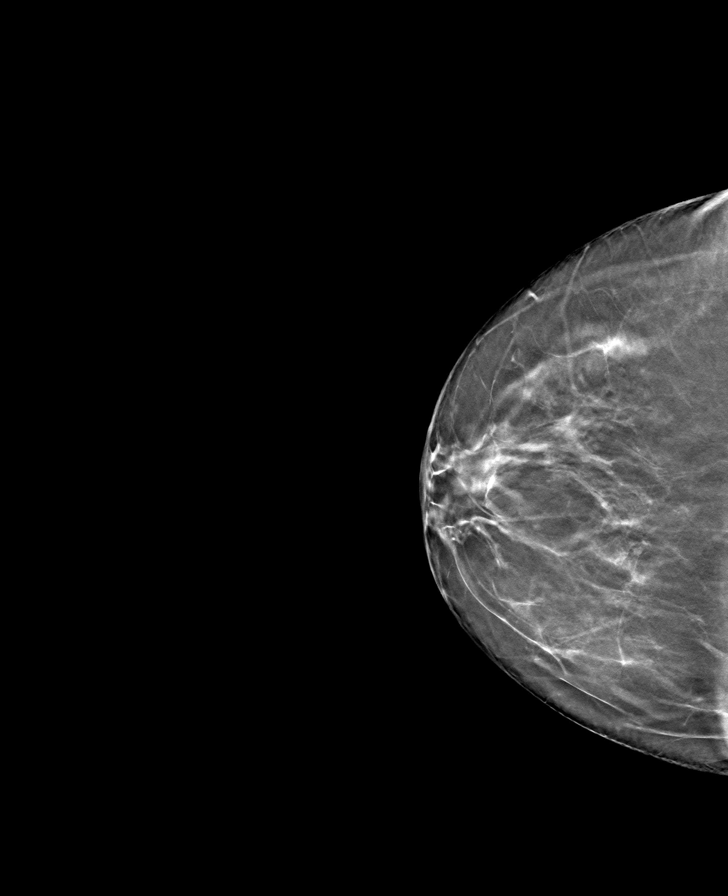

[L MLO tomo · tomo slice 36/71.0]
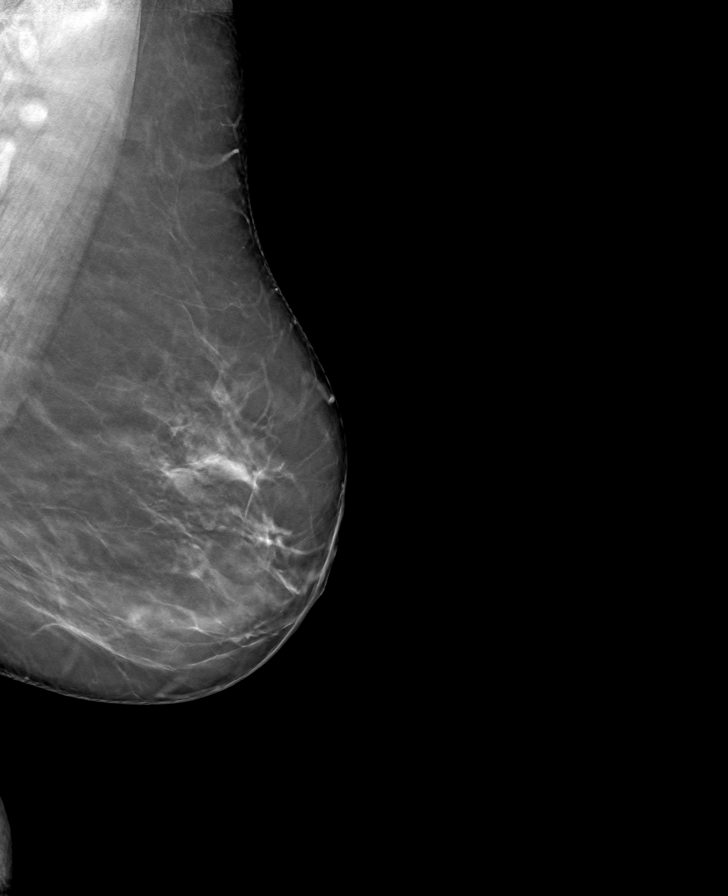

[8 of 24 positions shown; findings below may reference images not displayed]

ACR Breast Density Category b: There are scattered areas of
fibroglandular density.
FINDINGS: There are no findings suspicious for malignancy. Images were
processed with CAD.
IMPRESSION: No mammographic evidence of malignancy. A result letter of this
screening mammogram will be mailed directly to the patient.

RECOMMENDATION:
Screening mammogram in one year. (Code:CN-U-775)

BI-RADS CATEGORY  1: Negative.

## 2020-02-20 ENCOUNTER — Ambulatory Visit (INDEPENDENT_AMBULATORY_CARE_PROVIDER_SITE_OTHER): Payer: 59 | Admitting: Dermatology

## 2020-02-20 ENCOUNTER — Other Ambulatory Visit: Payer: Self-pay

## 2020-02-20 DIAGNOSIS — L820 Inflamed seborrheic keratosis: Secondary | ICD-10-CM | POA: Diagnosis not present

## 2020-02-20 DIAGNOSIS — S80861A Insect bite (nonvenomous), right lower leg, initial encounter: Secondary | ICD-10-CM | POA: Diagnosis not present

## 2020-02-20 DIAGNOSIS — W57XXXA Bitten or stung by nonvenomous insect and other nonvenomous arthropods, initial encounter: Secondary | ICD-10-CM

## 2020-02-20 DIAGNOSIS — I781 Nevus, non-neoplastic: Secondary | ICD-10-CM

## 2020-02-20 NOTE — Progress Notes (Signed)
blood   Follow-Up Visit   Subjective  Courtney Oneill is a 65 y.o. female who presents for the following: Areas of Concern.  Patient presents today for evaluation of a few areas of concern on her forehead, left cheek, right thigh, left knee, right wrist and left forearm. Some have been itchy and she picks at them  The following portions of the chart were reviewed this encounter and updated as appropriate:  Tobacco  Allergies  Meds  Problems  Med Hx  Surg Hx  Fam Hx     Review of Systems:  No other skin or systemic complaints except as noted in HPI or Assessment and Plan.  Objective  Well appearing patient in no apparent distress; mood and affect are within normal limits.  A focused examination was performed including Forehead, left cheek, right thigh, left knee, right wrist, left forearm. Relevant physical exam findings are noted in the Assessment and Plan.  Objective  Superior forehead left of midline: Blanching blood vessel  Objective  Left Hand, Left Knee x 2 (2), Right Forearm, Right Thigh: Erythematous keratotic or waxy stuck-on papule or plaque.   Objective  Right Thigh: Erythematous papule   Assessment & Plan    Telangiectasia Superior forehead left of midline Laser treatment option discussed. Benign, observe.   Inflamed seborrheic keratosis (5) Left Hand; Left Knee x 2 (2); Right Forearm; Right Thigh  Cryotherapy today Prior to procedure, discussed risks of blister formation, small wound, skin dyspigmentation, or rare scar following cryotherapy.   Destruction of lesion - Right Thigh Complexity: simple   Destruction method: cryotherapy   Informed consent: discussed and consent obtained   Timeout:  patient name, date of birth, surgical site, and procedure verified Lesion destroyed using liquid nitrogen: Yes   Region frozen until ice ball extended beyond lesion: Yes   Outcome: patient tolerated procedure well with no complications   Post-procedure  details: wound care instructions given    Insect bite of right lower leg with local reaction, initial encounter Right Thigh 1% hydrocortisone Benign, observe.   Return in about 4 months (around 06/22/2020) for TBSE.  I, Donzetta Kohut, CMA, am acting as scribe for Sarina Ser, MD . Documentation: I have reviewed the above documentation for accuracy and completeness, and I agree with the above.  Sarina Ser, MD

## 2020-02-20 NOTE — Patient Instructions (Addendum)

## 2020-02-21 ENCOUNTER — Encounter: Payer: Self-pay | Admitting: Dermatology

## 2020-05-09 ENCOUNTER — Other Ambulatory Visit: Payer: Self-pay | Admitting: Internal Medicine

## 2020-05-11 ENCOUNTER — Other Ambulatory Visit: Payer: Self-pay | Admitting: Obstetrics & Gynecology

## 2020-05-11 DIAGNOSIS — N644 Mastodynia: Secondary | ICD-10-CM

## 2020-05-18 ENCOUNTER — Other Ambulatory Visit: Payer: Self-pay

## 2020-05-18 ENCOUNTER — Ambulatory Visit
Admission: RE | Admit: 2020-05-18 | Discharge: 2020-05-18 | Disposition: A | Discharge status: Home / Self Care | Payer: 59 | Source: Ambulatory Visit | Attending: Obstetrics & Gynecology | Admitting: Obstetrics & Gynecology

## 2020-05-18 DIAGNOSIS — N644 Mastodynia: Secondary | ICD-10-CM | POA: Diagnosis not present

## 2020-06-18 ENCOUNTER — Other Ambulatory Visit: Payer: Self-pay

## 2020-06-18 ENCOUNTER — Ambulatory Visit (INDEPENDENT_AMBULATORY_CARE_PROVIDER_SITE_OTHER): Payer: 59 | Admitting: Dermatology

## 2020-06-18 ENCOUNTER — Encounter: Payer: Self-pay | Admitting: Dermatology

## 2020-06-18 DIAGNOSIS — S00462A Insect bite (nonvenomous) of left ear, initial encounter: Secondary | ICD-10-CM | POA: Diagnosis not present

## 2020-06-18 DIAGNOSIS — L814 Other melanin hyperpigmentation: Secondary | ICD-10-CM

## 2020-06-18 DIAGNOSIS — Z1283 Encounter for screening for malignant neoplasm of skin: Secondary | ICD-10-CM

## 2020-06-18 DIAGNOSIS — W57XXXA Bitten or stung by nonvenomous insect and other nonvenomous arthropods, initial encounter: Secondary | ICD-10-CM | POA: Diagnosis not present

## 2020-06-18 DIAGNOSIS — L821 Other seborrheic keratosis: Secondary | ICD-10-CM

## 2020-06-18 DIAGNOSIS — I781 Nevus, non-neoplastic: Secondary | ICD-10-CM

## 2020-06-18 DIAGNOSIS — D229 Melanocytic nevi, unspecified: Secondary | ICD-10-CM

## 2020-06-18 DIAGNOSIS — D1800 Hemangioma unspecified site: Secondary | ICD-10-CM

## 2020-06-18 DIAGNOSIS — L988 Other specified disorders of the skin and subcutaneous tissue: Secondary | ICD-10-CM

## 2020-06-18 NOTE — Progress Notes (Signed)
   Follow-Up Visit   Subjective  Courtney Oneill is a 64 y.o. female who presents for the following: Annual Exam. The patient presents for Total-Body Skin Exam (TBSE) for skin cancer screening and mole check.  The following portions of the chart were reviewed this encounter and updated as appropriate:  Tobacco  Allergies  Meds  Problems  Med Hx  Surg Hx  Fam Hx     Review of Systems:  No other skin or systemic complaints except as noted in HPI or Assessment and Plan.  Objective  Well appearing patient in no apparent distress; mood and affect are within normal limits.  A full examination was performed including scalp, head, eyes, ears, nose, lips, neck, chest, axillae, abdomen, back, buttocks, bilateral upper extremities, bilateral lower extremities, hands, feet, fingers, toes, fingernails, and toenails. All findings within normal limits unless otherwise noted below.  Objective  L nasal bridge: Dilated blood vessels  Objective  L post auricular: Small erythematous papule.  Objective  Face: Rhytides and volume loss.   Assessment & Plan  Telangiectasia L nasal bridge Benign appearing, observe. Discussed BBL laser treatment option.  Patient declines today.  May consider in the future peer  Tick bite, unspecified site, initial encounter L post auricular No evidence of infection.  Observe for symptoms of headache malaise muscle aches joint aches fever nausea vomiting diarrhea.  If this occurs she should see her PCP Benign appering, observe.  Elastosis of skin Face Recommend Botox: 4 units each DAO, 27.5 units to the frown complex.  May schedule for Botox in the future.  Lentigines - Scattered tan macules - Discussed due to sun exposure - Benign, observe - Call for any changes  Seborrheic Keratoses - Stuck-on, waxy, tan-brown papules and plaques  - Discussed benign etiology and prognosis. - Observe - Call for any changes  Melanocytic Nevi - Tan-brown and/or  pink-flesh-colored symmetric macules and papules - Benign appearing on exam today - Observation - Call clinic for new or changing moles - Recommend daily use of broad spectrum spf 30+ sunscreen to sun-exposed areas.   Hemangiomas - Red papules - Discussed benign nature - Observe - Call for any changes  Actinic Damage - Chronic, secondary to cumulative UV/sun exposure - diffuse scaly erythematous macules with underlying dyspigmentation - Recommend daily broad spectrum sunscreen SPF 30+ to sun-exposed areas, reapply every 2 hours as needed.  - Call for new or changing lesions.  Skin cancer screening performed today.  Return in about 1 year (around 06/18/2021) for TBSE.  Luther Redo, CMA, am acting as scribe for Sarina Ser, MD .  Documentation: I have reviewed the above documentation for accuracy and completeness, and I agree with the above.  Sarina Ser, MD

## 2020-06-19 ENCOUNTER — Encounter: Payer: Self-pay | Admitting: Dermatology

## 2020-07-31 ENCOUNTER — Ambulatory Visit (INDEPENDENT_AMBULATORY_CARE_PROVIDER_SITE_OTHER): Payer: 59 | Admitting: Dermatology

## 2020-07-31 ENCOUNTER — Other Ambulatory Visit: Payer: Self-pay

## 2020-07-31 DIAGNOSIS — L821 Other seborrheic keratosis: Secondary | ICD-10-CM

## 2020-07-31 DIAGNOSIS — L814 Other melanin hyperpigmentation: Secondary | ICD-10-CM | POA: Diagnosis not present

## 2020-07-31 DIAGNOSIS — L820 Inflamed seborrheic keratosis: Secondary | ICD-10-CM

## 2020-07-31 NOTE — Progress Notes (Signed)
   Follow-Up Visit   Subjective  Courtney Oneill is a 65 y.o. female who presents for the following: lesion (Patient is here today concerning lesion on right hand and forehead. 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 Patient states she was seen by Dr. Gwen Pounds concerning right hand and told to come back if it bothered her. She states it had started itching. Area on forehead she reports was a red area but has went away. She denies other concerns at this time. ).  She has some brown spots that she would like treated.    The following portions of the chart were reviewed this encounter and updated as appropriate:        Objective  Well appearing patient in no apparent distress; mood and affect are within normal limits.  A focused examination was performed including right hand and forehead. Relevant physical exam findings are noted in the Assessment and Plan.  Objective  right hand dorsum x 1: Pink brown waxy macule  Images    Objective  left forearm x 1, right index finger at base x 1, right 3rd finger x 2 (4): Waxy tan macules -- Discussed benign etiology and prognosis.   Images      Assessment & Plan  Inflamed seborrheic keratosis right hand dorsum x 1  Destruction of lesion - right hand dorsum x 1  Destruction method: cryotherapy   Informed consent: discussed and consent obtained   Lesion destroyed using liquid nitrogen: Yes   Region frozen until ice ball extended beyond lesion: Yes   Outcome: patient tolerated procedure well with no complications   Post-procedure details: wound care instructions given    Seborrheic keratosis (4) left forearm x 1, right index finger at base x 1, right 3rd finger x 2  Discussed cosmetic procedure (cryotherapy), noncovered.  $60 for 1st lesion and $15 for each additional lesion if done on the same day.  Maximum charge $350.  One touch-up treatment included no charge. Discussed risks of treatment including  dyspigmentation, small scar, and/or recurrence. Recommend daily broad spectrum sunscreen SPF 30+ to treated areas once healed   Noncovered consent signed, LN2 x 4 today, $105     Destruction of lesion - left forearm x 1, right index finger at base x 1, right 3rd finger x 2  Destruction method: cryotherapy   Informed consent: discussed and consent obtained   Lesion destroyed using liquid nitrogen: Yes   Region frozen until ice ball extended beyond lesion: Yes   Outcome: patient tolerated procedure well with no complications   Post-procedure details: wound care instructions given   Additional details:  Prior to procedure, discussed risks of blister formation, small wound, skin dyspigmentation, or rare scar following cryotherapy.     Lentigines - Scattered tan macules - Discussed due to sun exposure - Benign, observe - Recommend daily broad spectrum sunscreen SPF 30+ to sun-exposed areas, reapply every 2 hours as needed. - Call for any changes  Return in about 2 months (around 09/28/2020) for recheck SKs.  I, Asher Muir, CMA, am acting as scribe for Willeen Niece, MD.  Documentation: I have reviewed the above documentation for accuracy and completeness, and I agree with the above.  Willeen Niece MD

## 2020-07-31 NOTE — Patient Instructions (Addendum)
Cryotherapy Aftercare  . Wash gently with soap and water everyday.   Marland Kitchen Apply Vaseline and Band-Aid daily until healed.  Seborrheic Keratosis  What causes seborrheic keratoses? Seborrheic keratoses are harmless, common skin growths that first appear during adult life.  As time goes by, more growths appear.  Some people may develop a large number of them.  Seborrheic keratoses appear on both covered and uncovered body parts.  They are not caused by sunlight.  The tendency to develop seborrheic keratoses can be inherited.  They vary in color from skin-colored to gray, brown, or even black.  They can be either smooth or have a rough, warty surface.   Seborrheic keratoses are superficial and look as if they were stuck on the skin.  Under the microscope this type of keratosis looks like layers upon layers of skin.  That is why at times the top layer may seem to fall off, but the rest of the growth remains and re-grows.    Treatment Seborrheic keratoses do not need to be treated, but can easily be removed in the office.  Seborrheic keratoses often cause symptoms when they rub on clothing or jewelry.  Lesions can be in the way of shaving.  If they become inflamed, they can cause itching, soreness, or burning.  Removal of a seborrheic keratosis can be accomplished by freezing, burning, or surgery. If any spot bleeds, scabs, or grows rapidly, please return to have it checked, as these can be an indication of a skin cancer.  Discussed cosmetic procedure, noncovered.  $60 for 1st lesion and $15 for each additional lesion if done on the same day.  Maximum charge $350.  One touch-up treatment included no charge. Discussed risks of treatment including dyspigmentation, small scar, and/or recurrence.

## 2020-09-11 ENCOUNTER — Ambulatory Visit (INDEPENDENT_AMBULATORY_CARE_PROVIDER_SITE_OTHER): Payer: Self-pay | Admitting: Dermatology

## 2020-09-11 ENCOUNTER — Encounter: Payer: Self-pay | Admitting: Dermatology

## 2020-09-11 ENCOUNTER — Other Ambulatory Visit: Payer: Self-pay

## 2020-09-11 DIAGNOSIS — L988 Other specified disorders of the skin and subcutaneous tissue: Secondary | ICD-10-CM

## 2020-09-11 NOTE — Progress Notes (Deleted)
   Follow-Up Visit   Subjective  Courtney Oneill is a 65 y.o. female who presents for the following: Facial Elastosis (Face, pt presents for botox).   The following portions of the chart were reviewed this encounter and updated as appropriate:       Review of Systems:  No other skin or systemic complaints except as noted in HPI or Assessment and Plan.  Objective  Well appearing patient in no apparent distress; mood and affect are within normal limits.  A focused examination was performed including face. Relevant physical exam findings are noted in the Assessment and Plan.  Objective  Head - Anterior (Face): Rhytides and volume loss.   Images                   Assessment & Plan  Elastosis of skin Head - Anterior (Face)  Botox 45.5 units injected today to: - Frown complex 27.5 units - DAO 4 units x 2 total of 8 units - Crow's feet 5 units each side total of 10 units  Botox Injection - Head - Anterior (Face) Location: Frown complex, DAO's, Crow's feet  Informed consent: Discussed risks (infection, pain, bleeding, bruising, swelling, allergic reaction, paralysis of nearby muscles, eyelid droop, double vision, neck weakness, difficulty breathing, headache, undesirable cosmetic result, and need for additional treatment) and benefits of the procedure, as well as the alternatives.  Informed consent was obtained.  Preparation: The area was cleansed with alcohol.  Procedure Details:  Botox was injected into the dermis with a 30-gauge needle. Pressure applied to any bleeding. Ice packs offered for swelling.  Lot Number:  [id number] Expiration:  10/2022  Total Units Injected:  45.5  Plan: Patient was instructed to remain upright for 4 hours. Patient was instructed to avoid massaging the face and avoid vigorous exercise for the rest of the day. Tylenol may be used for headache.  Allow 2 weeks before returning to clinic for additional dosing as needed. Patient will call  for any problems.   Return for 3-64m Botox.   I, Othelia Pulling, RMA, am acting as scribe for Sarina Ser, MD .

## 2020-09-11 NOTE — Progress Notes (Unsigned)
   Follow-Up Visit   Subjective  Courtney Oneill is a 65 y.o. female who presents for the following: Facial Elastosis (Face, pt presents for botox).  The following portions of the chart were reviewed this encounter and updated as appropriate:   Tobacco  Allergies  Meds  Problems  Med Hx  Surg Hx  Fam Hx     Review of Systems:  No other skin or systemic complaints except as noted in HPI or Assessment and Plan.  Objective  Well appearing patient in no apparent distress; mood and affect are within normal limits.  A focused examination was performed including face. Relevant physical exam findings are noted in the Assessment and Plan.  Objective  Head - Anterior (Face): Rhytides and volume loss.   Images                   Assessment & Plan  Elastosis of skin Head - Anterior (Face)  Botox 45.5 units injected today to: - Frown complex 27.5 units - DAO 4 units x 2 total of 8 units - Crow's feet 5 units each side total of 10 units  Botox Injection - Head - Anterior (Face) Location: Frown complex, DAO's, Crow's feet  Informed consent: Discussed risks (infection, pain, bleeding, bruising, swelling, allergic reaction, paralysis of nearby muscles, eyelid droop, double vision, neck weakness, difficulty breathing, headache, undesirable cosmetic result, and need for additional treatment) and benefits of the procedure, as well as the alternatives.  Informed consent was obtained.  Preparation: The area was cleansed with alcohol.  Procedure Details:  Botox was injected into the dermis with a 30-gauge needle. Pressure applied to any bleeding. Ice packs offered for swelling.  Lot Number:  [id number] Expiration:  10/2022  Total Units Injected:  45.5  Plan: Patient was instructed to remain upright for 4 hours. Patient was instructed to avoid massaging the face and avoid vigorous exercise for the rest of the day. Tylenol may be used for headache.  Allow 2 weeks before returning  to clinic for additional dosing as needed. Patient will call for any problems.   Return for 3-7m Botox.   I, Othelia Pulling, RMA, am acting as scribe for Sarina Ser, MD .   Documentation: I have reviewed the above documentation for accuracy and completeness, and I agree with the above.  Sarina Ser, MD

## 2020-09-25 ENCOUNTER — Other Ambulatory Visit: Payer: Self-pay

## 2020-09-25 ENCOUNTER — Ambulatory Visit: Payer: 59 | Admitting: Dermatology

## 2020-09-25 DIAGNOSIS — L820 Inflamed seborrheic keratosis: Secondary | ICD-10-CM | POA: Diagnosis not present

## 2020-09-25 DIAGNOSIS — L821 Other seborrheic keratosis: Secondary | ICD-10-CM | POA: Diagnosis not present

## 2020-09-25 MED ORDER — HYDROQUINONE 4 % EX CREA: TOPICAL_CREAM | CUTANEOUS | 0 refills | Status: DC

## 2020-09-25 NOTE — Patient Instructions (Addendum)
Cryotherapy Aftercare  . Wash gently with soap and water everyday.   Marland Kitchen Apply Vaseline and Band-Aid daily until healed.  Once healed from cryotherapy, start hydroquinone 4% cream. Apply twice daily to dark spots (take a break after 3 months).

## 2020-09-25 NOTE — Progress Notes (Signed)
   Follow-Up Visit   Subjective  Courtney Oneill is a 65 y.o. female who presents for the following: Follow-up (Patient here for 2 month follow-up Sks and Isks treated with cryotherapy. There is still a residual spot on her right hand dorsum. She also has a spot on her right 4th finger that gets irritated by her ring.).   The following portions of the chart were reviewed this encounter and updated as appropriate:       Review of Systems:  No other skin or systemic complaints except as noted in HPI or Assessment and Plan.  Objective  Well appearing patient in no apparent distress; mood and affect are within normal limits.  A focused examination was performed including face, hands, fingers. Relevant physical exam findings are noted in the Assessment and Plan.  Objective  left forearm, right index finger at base, right 3rd finger: Light pink brown macules c/w healed LN2 sites  Objective  Right Hand Dorsum x 1, Right 4th finger at base x 1 (2): Violaceous tan waxy macule. Light tan waxy macule   Assessment & Plan  Seborrheic keratosis left forearm, right index finger at base, right 3rd finger  S/p Cosmetic LN2. Improved with mild PIH  Continue photoprotection and >spf 30 daily Start Hydroquinone 4% cream bid to spots  Observation. Recheck on follow-up.    Inflamed seborrheic keratosis (2) Right Hand Dorsum x 1, Right 4th finger at base x 1  Residual ISK with PIH.  Once healed from cryotherapy, start hydroquinone 4% cream BID, taking a break after 2-3 months.  Recommend broad spectrum SPF and photoprotection daily   hydroquinone 4 % cream - Right Hand Dorsum x 1, Right 4th finger at base x 1  Destruction of lesion - Right Hand Dorsum x 1, Right 4th finger at base x 1  Destruction method: cryotherapy   Informed consent: discussed and consent obtained   Lesion destroyed using liquid nitrogen: Yes   Region frozen until ice ball extended beyond lesion: Yes   Outcome:  patient tolerated procedure well with no complications   Post-procedure details: wound care instructions given   Additional details:  Prior to procedure, discussed risks of blister formation, small wound, skin dyspigmentation, or rare scar following cryotherapy.  Return in about 6 months (around 03/28/2021) for ISKs/SKs, PIH.   Documentation: I have reviewed the above documentation for accuracy and completeness, and I agree with the above.  Brendolyn Patty MD

## 2021-01-08 ENCOUNTER — Ambulatory Visit: Payer: 59 | Admitting: Dermatology

## 2021-01-16 ENCOUNTER — Other Ambulatory Visit: Payer: Self-pay

## 2021-01-16 ENCOUNTER — Ambulatory Visit (INDEPENDENT_AMBULATORY_CARE_PROVIDER_SITE_OTHER): Payer: Self-pay | Admitting: Dermatology

## 2021-01-16 DIAGNOSIS — L988 Other specified disorders of the skin and subcutaneous tissue: Secondary | ICD-10-CM

## 2021-01-16 NOTE — Progress Notes (Signed)
   Follow-Up Visit   Subjective  Courtney Oneill is a 65 y.o. female who presents for the following: Facial Elastosis (Pt here for facial botox ).  The following portions of the chart were reviewed this encounter and updated as appropriate:   Tobacco  Allergies  Meds  Problems  Med Hx  Surg Hx  Fam Hx      Review of Systems:  No other skin or systemic complaints except as noted in HPI or Assessment and Plan.  Objective  Well appearing patient in no apparent distress; mood and affect are within normal limits.  A focused examination was performed including face. Relevant physical exam findings are noted in the Assessment and Plan.  face Rhytides and volume loss.       Assessment & Plan  Elastosis of skin face  Botox 42 units injected today to: - Frown complex 30 units - DAO 4 units x 2 total of 8 units -Upper lip 4 units   Intralesional injection - face Location: See attached image  Informed consent: Discussed risks (infection, pain, bleeding, bruising, swelling, allergic reaction, paralysis of nearby muscles, eyelid droop, double vision, neck weakness, difficulty breathing, headache, undesirable cosmetic result, and need for additional treatment) and benefits of the procedure, as well as the alternatives.  Informed consent was obtained.  Preparation: The area was cleansed with alcohol.  Procedure Details:  Botox was injected into the dermis with a 30-gauge needle. Pressure applied to any bleeding. Ice packs offered for swelling.  Lot Number:  [id number] Expiration: 04/2022   Total Units Injected:  42  Plan: Patient was instructed to remain upright for 4 hours. Patient was instructed to avoid massaging the face and avoid vigorous exercise for the rest of the day. Tylenol may be used for headache.  Allow 2 weeks before returning to clinic for additional dosing as needed. Patient will call for any problems.  Return in about 4 months (around 05/18/2021) for botox.  IMarye Round, CMA, am acting as scribe for Sarina Ser, MD .  Documentation: I have reviewed the above documentation for accuracy and completeness, and I agree with the above.  Sarina Ser, MD

## 2021-01-16 NOTE — Patient Instructions (Signed)

## 2021-01-22 ENCOUNTER — Encounter: Payer: Self-pay | Admitting: Dermatology

## 2021-04-09 ENCOUNTER — Ambulatory Visit: Payer: 59 | Admitting: Dermatology

## 2021-04-18 ENCOUNTER — Other Ambulatory Visit: Payer: Self-pay | Admitting: Internal Medicine

## 2021-04-18 DIAGNOSIS — Z1231 Encounter for screening mammogram for malignant neoplasm of breast: Secondary | ICD-10-CM

## 2021-05-14 ENCOUNTER — Other Ambulatory Visit: Payer: Self-pay

## 2021-05-14 ENCOUNTER — Ambulatory Visit (INDEPENDENT_AMBULATORY_CARE_PROVIDER_SITE_OTHER): Payer: Self-pay | Admitting: Dermatology

## 2021-05-14 DIAGNOSIS — L988 Other specified disorders of the skin and subcutaneous tissue: Secondary | ICD-10-CM

## 2021-05-14 NOTE — Progress Notes (Signed)
   Follow-Up Visit   Subjective  Courtney Oneill is a 65 y.o. female who presents for the following: Facial Elastosis (Botox today).  The following portions of the chart were reviewed this encounter and updated as appropriate:   Tobacco  Allergies  Meds  Problems  Med Hx  Surg Hx  Fam Hx     Review of Systems:  No other skin or systemic complaints except as noted in HPI or Assessment and Plan.  Objective  Well appearing patient in no apparent distress; mood and affect are within normal limits.  A focused examination was performed including face. Relevant physical exam findings are noted in the Assessment and Plan.  Face Rhytides and volume loss.       Assessment & Plan  Elastosis of skin Face  Botox today 52 units total  Frown Complex 30 units Crow's Feet 5 units each side DAO 4 units each side Upper lip 4 units   Filling material injection - Face Location: See attached image  Informed consent: Discussed risks (infection, pain, bleeding, bruising, swelling, allergic reaction, paralysis of nearby muscles, eyelid droop, double vision, neck weakness, difficulty breathing, headache, undesirable cosmetic result, and need for additional treatment) and benefits of the procedure, as well as the alternatives.  Informed consent was obtained.  Preparation: The area was cleansed with alcohol.  Procedure Details:  Botox was injected into the dermis with a 30-gauge needle. Pressure applied to any bleeding. Ice packs offered for swelling.  Lot Number:  [id number] Expiration:  01/2023  Total Units Injected:  52  Plan: Patient was instructed to remain upright for 4 hours. Patient was instructed to avoid massaging the face and avoid vigorous exercise for the rest of the day. Tylenol may be used for headache.  Allow 2 weeks before returning to clinic for additional dosing as needed. Patient will call for any problems.   Return for Botox in 3-4 months.  I, Ashok Cordia, CMA, am  acting as scribe for Sarina Ser, MD .  Documentation: I have reviewed the above documentation for accuracy and completeness, and I agree with the above.  Sarina Ser, MD

## 2021-05-14 NOTE — Patient Instructions (Signed)

## 2021-05-15 ENCOUNTER — Encounter: Payer: Self-pay | Admitting: Dermatology

## 2021-05-20 ENCOUNTER — Ambulatory Visit
Admission: RE | Admit: 2021-05-20 | Discharge: 2021-05-20 | Disposition: A | Discharge status: Home / Self Care | Payer: Managed Care, Other (non HMO) | Source: Ambulatory Visit | Attending: Internal Medicine | Admitting: Internal Medicine

## 2021-05-20 ENCOUNTER — Other Ambulatory Visit: Payer: Self-pay

## 2021-05-20 DIAGNOSIS — Z1231 Encounter for screening mammogram for malignant neoplasm of breast: Secondary | ICD-10-CM | POA: Insufficient documentation

## 2021-07-01 ENCOUNTER — Encounter: Payer: Managed Care, Other (non HMO) | Admitting: Dermatology

## 2021-09-02 ENCOUNTER — Ambulatory Visit: Payer: Managed Care, Other (non HMO) | Admitting: Dermatology

## 2021-09-02 ENCOUNTER — Other Ambulatory Visit: Payer: Self-pay

## 2021-09-02 ENCOUNTER — Encounter: Payer: Self-pay | Admitting: Dermatology

## 2021-09-02 DIAGNOSIS — L821 Other seborrheic keratosis: Secondary | ICD-10-CM

## 2021-09-02 DIAGNOSIS — Z1283 Encounter for screening for malignant neoplasm of skin: Secondary | ICD-10-CM

## 2021-09-02 DIAGNOSIS — L309 Dermatitis, unspecified: Secondary | ICD-10-CM | POA: Diagnosis not present

## 2021-09-02 DIAGNOSIS — L814 Other melanin hyperpigmentation: Secondary | ICD-10-CM

## 2021-09-02 DIAGNOSIS — D1800 Hemangioma unspecified site: Secondary | ICD-10-CM

## 2021-09-02 DIAGNOSIS — D229 Melanocytic nevi, unspecified: Secondary | ICD-10-CM | POA: Diagnosis not present

## 2021-09-02 DIAGNOSIS — L820 Inflamed seborrheic keratosis: Secondary | ICD-10-CM | POA: Diagnosis not present

## 2021-09-02 DIAGNOSIS — L578 Other skin changes due to chronic exposure to nonionizing radiation: Secondary | ICD-10-CM

## 2021-09-02 MED ORDER — MOMETASONE FUROATE 0.1 % EX CREA
1.0000 | TOPICAL_CREAM | Freq: Two times a day (BID) | CUTANEOUS | 1 refills | Status: DC
Start: 2021-09-02 — End: 2022-11-17

## 2021-09-02 NOTE — Patient Instructions (Addendum)
Topical steroids (such as triamcinolone, fluocinolone, fluocinonide, mometasone, clobetasol, halobetasol, betamethasone, hydrocortisone) can cause thinning and lightening of the skin if they are used for too long in the same area. Your physician has selected the right strength medicine for your problem and area affected on the body. Please use your medication only as directed by your physician to prevent side effects.      Seborrheic Keratosis  What causes seborrheic keratoses? Seborrheic keratoses are harmless, common skin growths that first appear during adult life.  As time goes by, more growths appear.  Some people may develop a large number of them.  Seborrheic keratoses appear on both covered and uncovered body parts.  They are not caused by sunlight.  The tendency to develop seborrheic keratoses can be inherited.  They vary in color from skin-colored to gray, brown, or even black.  They can be either smooth or have a rough, warty surface.   Seborrheic keratoses are superficial and look as if they were stuck on the skin.  Under the microscope this type of keratosis looks like layers upon layers of skin.  That is why at times the top layer may seem to fall off, but the rest of the growth remains and re-grows.    Treatment Seborrheic keratoses do not need to be treated, but can easily be removed in the office.  Seborrheic keratoses often cause symptoms when they rub on clothing or jewelry.  Lesions can be in the way of shaving.  If they become inflamed, they can cause itching, soreness, or burning.  Removal of a seborrheic keratosis can be accomplished by freezing, burning, or surgery. If any spot bleeds, scabs, or grows rapidly, please return to have it checked, as these can be an indication of a skin cancer.  Cryotherapy Aftercare  Wash gently with soap and water everyday.   Apply Vaseline and Band-Aid daily until healed.          Melanoma ABCDEs  Melanoma is the most dangerous  type of skin cancer, and is the leading cause of death from skin disease.  You are more likely to develop melanoma if you: Have light-colored skin, light-colored eyes, or red or blond hair Spend a lot of time in the sun Tan regularly, either outdoors or in a tanning bed Have had blistering sunburns, especially during childhood Have a close family member who has had a melanoma Have atypical moles or large birthmarks  Early detection of melanoma is key since treatment is typically straightforward and cure rates are extremely high if we catch it early.   The first sign of melanoma is often a change in a mole or a new dark spot.  The ABCDE system is a way of remembering the signs of melanoma.  A for asymmetry:  The two halves do not match. B for border:  The edges of the growth are irregular. C for color:  A mixture of colors are present instead of an even brown color. D for diameter:  Melanomas are usually (but not always) greater than 82mm - the size of a pencil eraser. E for evolution:  The spot keeps changing in size, shape, and color.  Please check your skin once per month between visits. You can use a small mirror in front and a large mirror behind you to keep an eye on the back side or your body.   If you see any new or changing lesions before your next follow-up, please call to schedule a visit.  Please continue  daily skin protection including broad spectrum sunscreen SPF 30+ to sun-exposed areas, reapplying every 2 hours as needed when you're outdoors.   Staying in the shade or wearing long sleeves, sun glasses (UVA+UVB protection) and wide brim hats (4-inch brim around the entire circumference of the hat) are also recommended for sun protection.    If You Need Anything After Your Visit  If you have any questions or concerns for your doctor, please call our main line at [phone] and press option 4 to reach your doctor's medical assistant. If no one answers, please leave a  voicemail as directed and we will return your call as soon as possible. Messages left after 4 pm will be answered the following business day.   You may also send [place] a message via [place]. We typically respond to MyChart messages within 1-2 business days.  For prescription refills, please ask your pharmacy to contact our office. Our fax number is [phone].  If you have an urgent issue when the clinic is closed that cannot wait until the next business day, you can page your doctor at the number below.    Please note that while we do our best to be available for urgent issues outside of office hours, we are not available 24/7.   If you have an urgent issue and are unable to reach [place], you may choose to seek medical care at your doctor's office, retail clinic, urgent care center, or emergency room.  If you have a medical emergency, please immediately call 911 or go to the emergency department.  Pager Numbers  - Dr. Nehemiah Massed: [phone]  - Dr. Laurence Ferrari: [phone]  - Dr. Nicole Kindred: [phone]  In the event of inclement weather, please call our main line at [phone] for an update on the status of any delays or closures.  Dermatology Medication Tips: Please keep the boxes that topical medications come in in order to help keep track of the instructions about where and how to use these. Pharmacies typically print the medication instructions only on the boxes and not directly on the medication tubes.   If your medication is too expensive, please contact our office at [phone] option 4 or send [place] a message through [place].   We are unable to tell what your co-pay for medications will be in advance as this is different depending on your insurance coverage. However, we may be able to find a substitute medication at lower cost or fill out paperwork to get insurance to cover a needed medication.   If a prior authorization is required to get your medication covered by your insurance  company, please allow [place] 1-2 business days to complete this process.  Drug prices often vary depending on where the prescription is filled and some pharmacies may offer cheaper prices.  The website www.goodrx.com contains coupons for medications through different pharmacies. The prices here do not account for what the cost may be with help from insurance (it may be cheaper with your insurance), but the website can give you the price if you did not use any insurance.  - You can print the associated coupon and take it with your prescription to the pharmacy.  - You may also stop by our office during regular business hours and pick up a GoodRx coupon card.  - If you need your prescription sent electronically to a different pharmacy, notify our office through [facility] or by phone at [phone] option 4.     Si Usted Necesita Algo Despus de  Su Visita  Tambin puede enviarnos un mensaje a travs de MyChart. Por lo general respondemos a los mensajes de MyChart en el transcurso de 1 a 2 das hbiles.  Para renovar recetas, por favor pida a su farmacia que se ponga en contacto con nuestra oficina. Harland Dingwall de fax es [place] [phone].  Si tiene un asunto urgente cuando la clnica est cerrada y que no puede esperar hasta el siguiente da hbil, puede llamar/localizar a su doctor(a) al nmero que aparece a continuacin.   Por favor, tenga en cuenta que aunque hacemos todo lo posible para estar disponibles para asuntos urgentes fuera del horario de [place], no estamos disponibles las 24 horas del da, los 7 das de la [place].   Si tiene un problema urgente y no puede comunicarse con nosotros, puede optar por buscar atencin mdica  en el consultorio de su doctor(a), en una clnica privada, en un centro de atencin urgente o en una sala de emergencias.  Si tiene Engineering geologist, por favor llame inmediatamente al 911 o vaya a la sala de emergencias.  Nmeros de bper  - Dr.  Nehemiah Massed: [phone]  - Dra. Moye: [phone]  - Dra. Nicole Kindred: [phone]  En caso de inclemencias del [place], por favor llame a Johnsie Kindred principal al [phone] para una actualizacin sobre el [place] de cualquier retraso o cierre.  Consejos para la medicacin en dermatologa: Por favor, guarde las cajas en las que vienen los medicamentos de uso tpico para ayudarle a seguir las instrucciones sobre dnde y cmo usarlos. Las farmacias generalmente imprimen las instrucciones del medicamento slo en las cajas y no directamente en los tubos del [place].   Si su medicamento es muy caro, por favor, pngase en contacto con Zigmund Daniel llamando al [phone] y presione la opcin 4 o envenos un mensaje a travs de Pharmacist, community.   No podemos decirle cul ser su copago por los medicamentos por adelantado ya que esto es diferente dependiendo de la cobertura de su seguro. Sin embargo, es posible que podamos encontrar un medicamento sustituto a Electrical engineer un formulario para que el seguro cubra el medicamento que se considera necesario.   Si se requiere una autorizacin previa para que su compaa de seguros [place] su medicamento, por favor permtanos de 1 a 2 das hbiles para completar este proceso.  Los precios de los medicamentos varan con frecuencia dependiendo del Environmental consultant de dnde se surte la receta y alguna farmacias pueden ofrecer precios ms baratos.  El sitio web www.goodrx.com tiene cupones para medicamentos de Airline pilot. Los precios aqu no tienen en cuenta lo que podra costar con la ayuda del seguro (puede ser ms barato con su seguro), pero el sitio web puede darle el precio si no utiliz Research scientist (physical sciences).  - Puede imprimir el cupn correspondiente y llevarlo con su receta a la farmacia.  - Tambin puede pasar por nuestra oficina durante el horario de atencin regular y Charity fundraiser una tarjeta de cupones de GoodRx.  - Si necesita que su receta se enve  electrnicamente a una farmacia diferente, informe a nuestra oficina a travs de MyChart de [place] o por telfono llamando al [phone] y presione la opcin 4.

## 2021-09-02 NOTE — Progress Notes (Signed)
Follow-Up Visit   Subjective  Courtney Oneill is a 66 y.o. female who presents for the following: Annual Exam (Patient here today for tbse. She reports a spot at left side of neck that she noticed a month ago. ). The patient presents for Total-Body Skin Exam (TBSE) for skin cancer screening and mole check.  The patient has spots, moles and lesions to be evaluated, some may be new or changing and the patient has concerns that these could be cancer.  The following portions of the chart were reviewed this encounter and updated as appropriate:  Tobacco   Allergies   Meds   Problems   Med Hx   Surg Hx   Fam Hx      Review of Systems: No other skin or systemic complaints except as noted in HPI or Assessment and Plan.  Objective  Well appearing patient in no apparent distress; mood and affect are within normal limits.  A full examination was performed including scalp, head, eyes, ears, nose, lips, neck, chest, axillae, abdomen, back, buttocks, bilateral upper extremities, bilateral lower extremities, hands, feet, fingers, toes, fingernails, and toenails. All findings within normal limits unless otherwise noted below.  left lateral posterior neck Scaly erythematous papules and patches +/- dyspigmentation, lichenification, excoriations.   left lateral posterior neck x 1 Erythematous stuck-on, waxy papule or plaque   Assessment & Plan  Eczema, left lateral posterior neck  Start mometasone 0.1 % cream - apply topically to affected area of neck twice daily 5 x weekly until clear  Atopic dermatitis (eczema) is a chronic, relapsing, pruritic condition that can significantly affect quality of life. It is often associated with allergic rhinitis and/or asthma and can require treatment with topical medications, phototherapy, or in severe cases biologic injectable medication (Dupixent; Adbry) or Oral JAK inhibitors.  Topical steroids (such as triamcinolone, fluocinolone, fluocinonide, mometasone,  clobetasol, halobetasol, betamethasone, hydrocortisone) can cause thinning and lightening of the skin if they are used for too long in the same area. Your physician has selected the right strength medicine for your problem and area affected on the body. Please use your medication only as directed by your physician to prevent side effects.   mometasone (ELOCON) 0.1 % cream - left lateral posterior neck Apply 1 application topically in the morning and at bedtime. Use for up to 5 days a week until clear. Apply to rash at neck  Inflamed seborrheic keratosis left lateral posterior neck x 1 Irritated   Destruction of lesion - left lateral posterior neck x 1 Complexity: simple   Destruction method: cryotherapy   Informed consent: discussed and consent obtained   Timeout:  patient name, date of birth, surgical site, and procedure verified Lesion destroyed using liquid nitrogen: Yes   Region frozen until ice ball extended beyond lesion: Yes   Outcome: patient tolerated procedure well with no complications   Post-procedure details: wound care instructions given   Additional details:  Prior to procedure, discussed risks of blister formation, small wound, skin dyspigmentation, or rare scar following cryotherapy. Recommend Vaseline ointment to treated areas while healing.  Lentigines - Scattered tan macules - Due to sun exposure - Benign-appearing, observe - Recommend daily broad spectrum sunscreen SPF 30+ to sun-exposed areas, reapply every 2 hours as needed. - Call for any changes  Seborrheic Keratoses - Stuck-on, waxy, tan-brown papules and/or plaques  - Benign-appearing - Discussed benign etiology and prognosis. - Observe - Call for any changes  Melanocytic Nevi - Tan-brown and/or pink-flesh-colored symmetric macules  and papules - Benign appearing on exam today - Observation - Call clinic for new or changing moles - Recommend daily use of broad spectrum spf 30+ sunscreen to sun-exposed  areas.   Hemangiomas - Red papules - Discussed benign nature - Observe - Call for any changes  Actinic Damage - Chronic condition, secondary to cumulative UV/sun exposure - diffuse scaly erythematous macules with underlying dyspigmentation - Recommend daily broad spectrum sunscreen SPF 30+ to sun-exposed areas, reapply every 2 hours as needed.  - Staying in the shade or wearing long sleeves, sun glasses (UVA+UVB protection) and wide brim hats (4-inch brim around the entire circumference of the hat) are also recommended for sun protection.  - Call for new or changing lesions.  Skin cancer screening performed today.  Return for 1 year tbse. IRuthell Rummage, CMA, am acting as scribe for Sarina Ser, MD. Documentation: I have reviewed the above documentation for accuracy and completeness, and I agree with the above.  Sarina Ser, MD

## 2021-09-17 ENCOUNTER — Ambulatory Visit: Payer: Managed Care, Other (non HMO) | Admitting: Dermatology

## 2021-09-24 ENCOUNTER — Ambulatory Visit: Payer: Managed Care, Other (non HMO) | Admitting: Dermatology

## 2021-10-29 ENCOUNTER — Ambulatory Visit: Payer: Managed Care, Other (non HMO) | Admitting: Dermatology

## 2021-11-04 ENCOUNTER — Telehealth: Payer: Self-pay

## 2021-11-04 MED ORDER — METRONIDAZOLE 1 % EX GEL: Freq: Every day | CUTANEOUS | 11 refills | Status: DC

## 2021-11-04 NOTE — Telephone Encounter (Signed)
RX sent in and patient advised. aw 

## 2021-11-04 NOTE — Telephone Encounter (Signed)
Patient called asking for a RF of her Metronidazole Gel. She was recently here for 1 year TBSE but it has been multiple years since rosacea or RX has been discussed. OK to RF? ?

## 2022-01-16 ENCOUNTER — Other Ambulatory Visit: Payer: Self-pay | Admitting: Internal Medicine

## 2022-01-16 DIAGNOSIS — Z1231 Encounter for screening mammogram for malignant neoplasm of breast: Secondary | ICD-10-CM

## 2022-02-25 ENCOUNTER — Ambulatory Visit: Payer: BC Managed Care – PPO | Admitting: Dermatology

## 2022-03-05 ENCOUNTER — Ambulatory Visit: Payer: BC Managed Care – PPO | Admitting: Dermatology

## 2022-03-05 DIAGNOSIS — L570 Actinic keratosis: Secondary | ICD-10-CM

## 2022-03-05 DIAGNOSIS — C4492 Squamous cell carcinoma of skin, unspecified: Secondary | ICD-10-CM

## 2022-03-05 DIAGNOSIS — D0471 Carcinoma in situ of skin of right lower limb, including hip: Secondary | ICD-10-CM | POA: Diagnosis not present

## 2022-03-05 DIAGNOSIS — L578 Other skin changes due to chronic exposure to nonionizing radiation: Secondary | ICD-10-CM | POA: Diagnosis not present

## 2022-03-05 DIAGNOSIS — D492 Neoplasm of unspecified behavior of bone, soft tissue, and skin: Secondary | ICD-10-CM

## 2022-03-05 DIAGNOSIS — L814 Other melanin hyperpigmentation: Secondary | ICD-10-CM | POA: Diagnosis not present

## 2022-03-05 HISTORY — DX: Squamous cell carcinoma of skin, unspecified: C44.92

## 2022-03-05 HISTORY — DX: Actinic keratosis: L57.0

## 2022-03-05 MED ORDER — TOLAK 4 % EX CREA
TOPICAL_CREAM | CUTANEOUS | 0 refills | Status: DC
Start: 2022-03-05 — End: 2022-03-19

## 2022-03-05 NOTE — Patient Instructions (Addendum)
Wound Care Instructions  Cleanse wound gently with soap and water once a day then pat dry with clean gauze. Apply a thin coat of Petrolatum (petroleum jelly, "Vaseline") over the wound (unless you have an allergy to this). We recommend that you use a new, sterile tube of Vaseline. Do not pick or remove scabs. Do not remove the yellow or white "healing tissue" from the base of the wound.  Cover the wound with fresh, clean, nonstick gauze and secure with paper tape. You may use Band-Aids in place of gauze and tape if the wound is small enough, but would recommend trimming much of the tape off as there is often too much. Sometimes Band-Aids can irritate the skin.  You should call the office for your biopsy report after 1 week if you have not already been contacted.  If you experience any problems, such as abnormal amounts of bleeding, swelling, significant bruising, significant pain, or evidence of infection, please call the office immediately.  FOR ADULT SURGERY PATIENTS: If you need something for pain relief you may take 1 extra strength Tylenol (acetaminophen) AND 2 Ibuprofen (200mg each) together every 4 hours as needed for pain. (do not take these if you are allergic to them or if you have a reason you should not take them.) Typically, you may only need pain medication for 1 to 3 days.     Cryotherapy Aftercare  Wash gently with soap and water everyday.   Apply Vaseline and Band-Aid daily until healed.    Due to recent changes in healthcare laws, you may see results of your pathology and/or laboratory studies on MyChart before the doctors have had a chance to review them. We understand that in some cases there may be results that are confusing or concerning to you. Please understand that not all results are received at the same time and often the doctors may need to interpret multiple results in order to provide you with the best plan of care or course of treatment. Therefore, we ask that you  please give us 2 business days to thoroughly review all your results before contacting the office for clarification. Should we see a critical lab result, you will be contacted sooner.   If You Need Anything After Your Visit  If you have any questions or concerns for your doctor, please call our main line at [phone] and press option 4 to reach your doctor's medical assistant. If no one answers, please leave a voicemail as directed and we will return your call as soon as possible. Messages left after 4 pm will be answered the following business day.   You may also send us a message via MyChart. We typically respond to MyChart messages within 1-2 business days.  For prescription refills, please ask your pharmacy to contact our office. Our fax number is [phone].  If you have an urgent issue when the clinic is closed that cannot wait until the next business day, you can page your doctor at the number below.    Please note that while we do our best to be available for urgent issues outside of office hours, we are not available 24/7.   If you have an urgent issue and are unable to reach us, you may choose to seek medical care at your doctor's office, retail clinic, urgent care center, or emergency room.  If you have a medical emergency, please immediately call 911 or go to the emergency department.  Pager Numbers  - Dr. Kowalski: [phone]  -   Dr. Moye: [phone]  - Dr. Stewart: [phone]  In the event of inclement weather, please call our main line at [phone] for an update on the status of any delays or closures.  Dermatology Medication Tips: Please keep the boxes that topical medications come in in order to help keep track of the instructions about where and how to use these. Pharmacies typically print the medication instructions only on the boxes and not directly on the medication tubes.   If your medication is too expensive, please contact our office at  [phone] option 4 or send us a message through MyChart.   We are unable to tell what your co-pay for medications will be in advance as this is different depending on your insurance coverage. However, we may be able to find a substitute medication at lower cost or fill out paperwork to get insurance to cover a needed medication.   If a prior authorization is required to get your medication covered by your insurance company, please allow us 1-2 business days to complete this process.  Drug prices often vary depending on where the prescription is filled and some pharmacies may offer cheaper prices.  The website www.goodrx.com contains coupons for medications through different pharmacies. The prices here do not account for what the cost may be with help from insurance (it may be cheaper with your insurance), but the website can give you the price if you did not use any insurance.  - You can print the associated coupon and take it with your prescription to the pharmacy.  - You may also stop by our office during regular business hours and pick up a GoodRx coupon card.  - If you need your prescription sent electronically to a different pharmacy, notify our office through [place] MyChart or by phone at [phone] option 4.     Si Usted Necesita Algo Despus de Su Visita  Tambin puede enviarnos un mensaje a travs de MyChart. Por lo general respondemos a los mensajes de MyChart en el transcurso de 1 a 2 das hbiles.  Para renovar recetas, por favor pida a su farmacia que se ponga en contacto con nuestra oficina. Nuestro nmero de fax es el [phone].  Si tiene un asunto urgente cuando la clnica est cerrada y que no puede esperar hasta el siguiente da hbil, puede llamar/localizar a su doctor(a) al nmero que aparece a continuacin.   Por favor, tenga en cuenta que aunque hacemos todo lo posible para estar disponibles para asuntos urgentes fuera del horario de oficina, no estamos  disponibles las 24 horas del da, los 7 das de la semana.   Si tiene un problema urgente y no puede comunicarse con nosotros, puede optar por buscar atencin mdica  en el consultorio de su doctor(a), en una clnica privada, en un centro de atencin urgente o en una sala de emergencias.  Si tiene una emergencia mdica, por favor llame inmediatamente al 911 o vaya a la sala de emergencias.  Nmeros de bper  - Dr. Kowalski: [phone]  - Dra. Moye: [phone]  - Dra. Stewart: [phone]  En caso de inclemencias del tiempo, por favor llame a nuestra lnea principal al [phone] para una actualizacin sobre el estado de cualquier retraso o cierre.  Consejos para la medicacin en dermatologa: Por favor, guarde las cajas en las que vienen los medicamentos de uso tpico para ayudarle a seguir las instrucciones sobre dnde y cmo usarlos. Las farmacias generalmente imprimen las instrucciones del medicamento slo en las cajas y   no directamente en los tubos del medicamento.   Si su medicamento es muy caro, por favor, pngase en contacto con nuestra oficina llamando al [phone] y presione la opcin 4 o envenos un mensaje a travs de MyChart.   No podemos decirle cul ser su copago por los medicamentos por adelantado ya que esto es diferente dependiendo de la cobertura de su seguro. Sin embargo, es posible que podamos encontrar un medicamento sustituto a menor costo o llenar un formulario para que el seguro cubra el medicamento que se considera necesario.   Si se requiere una autorizacin previa para que su compaa de seguros cubra su medicamento, por favor permtanos de 1 a 2 das hbiles para completar este proceso.  Los precios de los medicamentos varan con frecuencia dependiendo del lugar de dnde se surte la receta y alguna farmacias pueden ofrecer precios ms baratos.  El sitio web www.goodrx.com tiene cupones para medicamentos de diferentes farmacias. Los precios aqu no  tienen en cuenta lo que podra costar con la ayuda del seguro (puede ser ms barato con su seguro), pero el sitio web puede darle el precio si no utiliz ningn seguro.  - Puede imprimir el cupn correspondiente y llevarlo con su receta a la farmacia.  - Tambin puede pasar por nuestra oficina durante el horario de atencin regular y recoger una tarjeta de cupones de GoodRx.  - Si necesita que su receta se enve electrnicamente a una farmacia diferente, informe a nuestra oficina a travs de MyChart de [place] o por telfono llamando al [phone] y presione la opcin 4.  

## 2022-03-05 NOTE — Progress Notes (Signed)
Follow-Up Visit   Subjective  Courtney Oneill is a 66 y.o. female who presents for the following: Skin Problem (Patient here today for a spot at right upper lip, present for months, patient pulled off but It came back. Spot of dry skin right above it. Also a spot at right lower leg below knee, present for a long time but has recently become raised and she hits it when shaving. ).  The following portions of the chart were reviewed this encounter and updated as appropriate:   Tobacco  Allergies  Meds  Problems  Med Hx  Surg Hx  Fam Hx      Review of Systems:  No other skin or systemic complaints except as noted in HPI or Assessment and Plan.  Objective  Well appearing patient in no apparent distress; mood and affect are within normal limits.  A focused examination was performed including face, right leg. Relevant physical exam findings are noted in the Assessment and Plan.  Right proximal pretibia 0.6 cm pink and tan papule R/o DF vs SCC  Right Anterior Thigh 0.45 cm pink papule with irregular vascular pattern  right upper cutaneous lip at vermillion border, right dorsal forearm (2) Erythematous thin papules/macules with gritty scale.     Assessment & Plan  Neoplasm of skin (2) Right proximal pretibia  Skin / nail biopsy Type of biopsy: tangential   Informed consent: discussed and consent obtained   Timeout: patient name, date of birth, surgical site, and procedure verified   Procedure prep:  Patient was prepped and draped in usual sterile fashion Prep type:  Isopropyl alcohol Anesthesia: the lesion was anesthetized in a standard fashion   Anesthetic:  1% lidocaine w/ epinephrine 1-100,000 buffered w/ 8.4% NaHCO3 Instrument used: flexible razor blade   Hemostasis achieved with: aluminum chloride   Outcome: patient tolerated procedure well   Post-procedure details: wound care instructions given   Additional details:  Petrolatum and a pressure bandage  applied  Specimen 1 - Surgical pathology Differential Diagnosis: R/o DF vs SCC  Check Margins: No 0.6 cm pink and tan papule   Right Anterior Thigh  Skin / nail biopsy Type of biopsy: punch   Informed consent: discussed and consent obtained   Timeout: patient name, date of birth, surgical site, and procedure verified   Procedure prep:  Patient was prepped and draped in usual sterile fashion Prep type:  Isopropyl alcohol Anesthesia: the lesion was anesthetized in a standard fashion   Anesthetic:  1% lidocaine w/ epinephrine 1-100,000 buffered w/ 8.4% NaHCO3 Punch size:  5 mm Suture size:  4-0 Suture type: Prolene (polypropylene)   Hemostasis achieved with: suture   Outcome: patient tolerated procedure well   Post-procedure details: wound care instructions given   Additional details:  Petrolatum and a pressure dressing were applied  Specimen 2 - Surgical pathology Differential Diagnosis: amelanotic melanoma vs other  Check Margins: No 0.45 cm pink papule with irregular vascular pattern  AK (actinic keratosis) (2) right upper cutaneous lip at vermillion border, right dorsal forearm  Actinic keratoses are precancerous spots that appear secondary to cumulative UV radiation exposure/sun exposure over time. They are chronic with expected duration over 1 year. A portion of actinic keratoses will progress to squamous cell carcinoma of the skin. It is not possible to reliably predict which spots will progress to skin cancer and so treatment is recommended to prevent development of skin cancer.  Recommend daily broad spectrum sunscreen SPF 30+ to sun-exposed areas, reapply every  2 hours as needed.  Recommend staying in the shade or wearing long sleeves, sun glasses (UVA+UVB protection) and wide brim hats (4-inch brim around the entire circumference of the hat). Call for new or changing lesions.  Start [place] once daily x 4 weeks. Reviewed expected redness and crusting.  Fluorouracil  (TOLAK) 4 % CREA - right upper cutaneous lip at vermillion border, right dorsal forearm Once daily x 4 weeks.   Actinic Damage - chronic, secondary to cumulative UV radiation exposure/sun exposure over time - diffuse scaly erythematous macules with underlying dyspigmentation - Recommend daily broad spectrum sunscreen SPF 30+ to sun-exposed areas, reapply every 2 hours as needed.  - Recommend staying in the shade or wearing long sleeves, sun glasses (UVA+UVB protection) and wide brim hats (4-inch brim around the entire circumference of the hat). - Call for new or changing lesions.  Lentigines - Scattered tan macules - Due to sun exposure - Benign-appering, observe - Recommend daily broad spectrum sunscreen SPF 30+ to sun-exposed areas, reapply every 2 hours as needed. - Call for any changes   Return in about 2 weeks (around 03/19/2022) for Suture Removal.  Graciella Belton, RMA, am acting as scribe for Forest Gleason, MD .  Documentation: I have reviewed the above documentation for accuracy and completeness, and I agree with the above.  Forest Gleason, MD

## 2022-03-06 ENCOUNTER — Telehealth: Payer: Self-pay

## 2022-03-06 NOTE — Telephone Encounter (Addendum)
  Called and discussed results with patient. She is ok with having procedures done at follow up appointment on 23rd.     ----- Message from Alfonso Patten, MD sent at 03/06/2022  5:32 PM EDT ----- 1. 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 Skin , right anterior thigh LICHENOID ACTINIC KERATOSIS --> LN2 in clinic  Can do both at suture removal  MAs please call. Thank you!

## 2022-03-17 ENCOUNTER — Encounter: Payer: Self-pay | Admitting: Dermatology

## 2022-03-19 ENCOUNTER — Ambulatory Visit: Payer: BC Managed Care – PPO | Admitting: Dermatology

## 2022-03-19 DIAGNOSIS — D0471 Carcinoma in situ of skin of right lower limb, including hip: Secondary | ICD-10-CM

## 2022-03-19 DIAGNOSIS — D099 Carcinoma in situ, unspecified: Secondary | ICD-10-CM

## 2022-03-19 DIAGNOSIS — L570 Actinic keratosis: Secondary | ICD-10-CM | POA: Diagnosis not present

## 2022-03-19 MED ORDER — FLUOROURACIL 5 % EX CREA
TOPICAL_CREAM | Freq: Two times a day (BID) | CUTANEOUS | 0 refills | Status: DC
Start: 2022-03-19 — End: 2022-03-19

## 2022-03-19 MED ORDER — FLUOROURACIL 5 % EX CREA
TOPICAL_CREAM | Freq: Two times a day (BID) | CUTANEOUS | 0 refills | Status: DC
Start: 2022-03-19 — End: 2023-10-12

## 2022-03-19 NOTE — Patient Instructions (Addendum)
Start 5-fluorouracil cream daily for 1 week to the spot at your lip. If you are red, crusty and tender before one week, stop early.  If you notice a gritty, sand-papery spot at your forearm, use the 5-fluorouracil cream twice a day for 2 weeks to a dime sized area around that spot.    5-Fluorouracil Patient Education   Actinic keratoses are the dry, red scaly spots on the skin caused by sun damage. A portion of these spots can turn into skin cancer with time, and treating them can help prevent development of skin cancer.   Treatment of these spots requires removal of the defective skin cells. There are various ways to remove actinic keratoses, including freezing with liquid nitrogen, treatment with creams, or treatment with a blue light procedure in the office.   5-fluorouracil cream is a topical cream used to treat actinic keratoses. It works by interfering with the growth of abnormal fast-growing skin cells, such as actinic keratoses. These cells peel off and are replaced by healthy ones.   INSTRUCTIONS FOR 5-FLUOROURACIL CREAM:   5-fluorouracil cream typically needs to be used for 7-21 days. A thin layer should be applied as recommended by your physician.   Do not use 5-fluorouracil/calcipotriene cream on infected or open wounds.   You will develop redness, irritation and some crusting at areas where you have pre-cancer damage/actinic keratoses. IF YOU DEVELOP PAIN, BLEEDING, OR SIGNIFICANT CRUSTING, STOP THE TREATMENT EARLY - you have already gotten a good response and the actinic keratoses should clear up well.  Wash your hands after applying 5-fluorouracil 5% cream on your skin.   A moisturizer or sunscreen with a minimum SPF 30 should be applied each morning.   Once you have finished the treatment, you can apply a thin layer of Vaseline twice a day to irritated areas to soothe and calm the areas more quickly. If you experience significant discomfort, contact your physician.  For  some patients it is necessary to repeat the treatment for best results.  SIDE EFFECTS: When using 5-fluorouracil cream, you may have mild irritation, such as redness, dryness, swelling, or a mild burning sensation. This usually resolves within 2 weeks. The more actinic keratoses you have, the more redness and inflammation you can expect during treatment. Eye irritation has been reported rarely. If this occurs, please let us know.   If you have any trouble using this cream, please call the office. If you have any other questions about this information, please do not hesitate to ask me before you leave the office.     Due to recent changes in healthcare laws, you may see results of your pathology and/or laboratory studies on MyChart before the doctors have had a chance to review them. We understand that in some cases there may be results that are confusing or concerning to you. Please understand that not all results are received at the same time and often the doctors may need to interpret multiple results in order to provide you with the best plan of care or course of treatment. Therefore, we ask that you please give [place] 2 business days to thoroughly review all your results before contacting the office for clarification. Should we see a critical lab result, you will be contacted sooner.    After Suture Removal  If your medical team has placed Steri-Strips (white adhesive strips covering the surgical site to provide extra support): Keep the area dry until they fall off.  Do not peel them off. Just let  them fall off on their own.  If the edges peel up, you can trim them with scissors.   If your team has not placed Steri-Strips: Wash the area daily with soap and water. Then coat the incision site with plain Vaseline and cover with a bandage. Do this daily for 5 days after the sutures are removed. After that, no additional wound care is generally needed.  However, if you would like to help fade the  scar, you can apply a silicone scar cream, gel or sheet every night. The scar will remodel for one year after the procedure. If a skin cancer was removed, be sure to keep your appointment with your dermatologist for follow-up and let your dermatology team know if you have any new or changing spots between visits.    Please call our office at [phone] for any questions or concerns.  If You Need Anything After Your Visit  If you have any questions or concerns for your doctor, please call our main line at [phone] and press option 4 to reach your doctor's medical assistant. If no one answers, please leave a voicemail as directed and we will return your call as soon as possible. Messages left after 4 pm will be answered the following business day.   You may also send [place] a message via [place]. We typically respond to MyChart messages within 1-2 business days.  For prescription refills, please ask your pharmacy to contact our office. Our fax number is [phone].  If you have an urgent issue when the clinic is closed that cannot wait until the next business day, you can page your doctor at the number below.    Please note that while we do our best to be available for urgent issues outside of office hours, we are not available 24/7.   If you have an urgent issue and are unable to reach [place], you may choose to seek medical care at your doctor's office, retail clinic, urgent care center, or emergency room.  If you have a medical emergency, please immediately call 911 or go to the emergency department.  Pager Numbers  - Dr. Nehemiah Massed: [phone]  - Dr. Laurence Ferrari: [phone]  - Dr. Nicole Kindred: [phone]  In the event of inclement weather, please call our main line at [phone] for an update on the status of any delays or closures.  Dermatology Medication Tips: Please keep the boxes that topical medications come in in order to help keep track of the instructions about where and how to  use these. Pharmacies typically print the medication instructions only on the boxes and not directly on the medication tubes.   If your medication is too expensive, please contact our office at [phone] option 4 or send [place] a message through [place].   We are unable to tell what your co-pay for medications will be in advance as this is different depending on your insurance coverage. However, we may be able to find a substitute medication at lower cost or fill out paperwork to get insurance to cover a needed medication.   If a prior authorization is required to get your medication covered by your insurance company, please allow [place] 1-2 business days to complete this process.  Drug prices often vary depending on where the prescription is filled and some pharmacies may offer cheaper prices.  The website www.goodrx.com contains coupons for medications through different pharmacies. The prices here do not account for what the cost may be with help from insurance (it may be cheaper  with your insurance), but the website can give you the price if you did not use any insurance.  - You can print the associated coupon and take it with your prescription to the pharmacy.  - You may also stop by our office during regular business hours and pick up a GoodRx coupon card.  - If you need your prescription sent electronically to a different pharmacy, notify our office through [facility] or by phone at [phone] option 4.     Si Usted Necesita Algo Despus de Su Visita  Tambin puede enviarnos un mensaje a travs de Pharmacist, community. Por lo general respondemos a los mensajes de MyChart en el transcurso de 1 a 2 das hbiles.  Para renovar recetas, por favor pida a su farmacia que se ponga en contacto con nuestra oficina. Harland Dingwall de fax es [place] [phone].  Si tiene un asunto urgente cuando la clnica est cerrada y que no puede esperar hasta el siguiente da hbil, puede llamar/localizar a su  doctor(a) al nmero que aparece a continuacin.   Por favor, tenga en cuenta que aunque hacemos todo lo posible para estar disponibles para asuntos urgentes fuera del horario de [place], no estamos disponibles las 24 horas del da, los 7 das de la [place].   Si tiene un problema urgente y no puede comunicarse con nosotros, puede optar por buscar atencin mdica  en el consultorio de su doctor(a), en una clnica privada, en un centro de atencin urgente o en una sala de emergencias.  Si tiene Engineering geologist, por favor llame inmediatamente al 911 o vaya a la sala de emergencias.  Nmeros de bper  - Dr. Nehemiah Massed: [phone]  - Dra. Moye: [phone]  - Dra. Nicole Kindred: [phone]  En caso de inclemencias del [place], por favor llame a Johnsie Kindred principal al [phone] para una actualizacin sobre el [place] de cualquier retraso o cierre.  Consejos para la medicacin en dermatologa: Por favor, guarde las cajas en las que vienen los medicamentos de uso tpico para ayudarle a seguir las instrucciones sobre dnde y cmo usarlos. Las farmacias generalmente imprimen las instrucciones del medicamento slo en las cajas y no directamente en los tubos del [place].   Si su medicamento es muy caro, por favor, pngase en contacto con Zigmund Daniel llamando al [phone] y presione la opcin 4 o envenos un mensaje a travs de Pharmacist, community.   No podemos decirle cul ser su copago por los medicamentos por adelantado ya que esto es diferente dependiendo de la cobertura de su seguro. Sin embargo, es posible que podamos encontrar un medicamento sustituto a Electrical engineer un formulario para que el seguro cubra el medicamento que se considera necesario.   Si se requiere una autorizacin previa para que su compaa de seguros [place] su medicamento, por favor permtanos de 1 a 2 das hbiles para completar este proceso.  Los precios de los medicamentos varan con frecuencia dependiendo del  Environmental consultant de dnde se surte la receta y alguna farmacias pueden ofrecer precios ms baratos.  El sitio web www.goodrx.com tiene cupones para medicamentos de Airline pilot. Los precios aqu no tienen en cuenta lo que podra costar con la ayuda del seguro (puede ser ms barato con su seguro), pero el sitio web puede darle el precio si no utiliz Research scientist (physical sciences).  - Puede imprimir el cupn correspondiente y llevarlo con su receta a la farmacia.  - Tambin puede pasar por nuestra oficina durante el horario de atencin regular y Corporate treasurer  tarjeta de cupones de GoodRx.  - Si necesita que su receta se enve electrnicamente a una farmacia diferente, informe a nuestra oficina a travs de MyChart de [place] o por telfono llamando al [phone] y presione la opcin 4.

## 2022-03-19 NOTE — Progress Notes (Signed)
Follow-Up Visit   Subjective  Courtney Oneill is a 66 y.o. female who presents for the following: Follow-up (Here for suture removal at area right anterior thigh. Is concerned with some redness at area and would like checked. ).  The following portions of the chart were reviewed this encounter and updated as appropriate:  Tobacco  Allergies  Meds  Problems  Med Hx  Surg Hx  Fam Hx      Review of Systems: No other skin or systemic complaints except as noted in HPI or Assessment and Plan.   Objective  Well appearing patient in no apparent distress; mood and affect are within normal limits.  A focused examination was performed including right thigh , right leg. Relevant physical exam findings are noted in the Assessment and Plan.  Head - Anterior (Face) Erythematous thin papules/macules with gritty scale.    Assessment & Plan   Actinic keratosis Head - Anterior (Face)  Actinic keratoses are precancerous spots that appear secondary to cumulative UV radiation exposure/sun exposure over time. They are chronic with expected duration over 1 year. A portion of actinic keratoses will progress to squamous cell carcinoma of the skin. It is not possible to reliably predict which spots will progress to skin cancer and so treatment is recommended to prevent development of skin cancer.  Recommend daily broad spectrum sunscreen SPF 30+ to sun-exposed areas, reapply every 2 hours as needed.  Recommend staying in the shade or wearing long sleeves, sun glasses (UVA+UVB protection) and wide brim hats (4-inch brim around the entire circumference of the hat). Call for new or changing lesions.  Start 5-fluorouracil cream Apply daily for 1 week to the lip. Patient provided with handout reviewing treatment course and side effects and advised to call or message [place] on MyChart with any concerns.   Related Medications fluorouracil (EFUDEX) 5 % cream Apply topically 2 (two) times daily. Use for 1 week  to the spot at your lip. Can also use at forearm twice a day for 2 weeks.  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 - Scars remodel for a full year. - Once steri-strips fall off, patient can apply over-the-counter silicone scar cream each night to help with scar remodeling if desired. - Patient advised to call with any concerns or if they notice any new or changing lesions.  Actinic Damage - Severe, confluent actinic changes with pre-cancerous actinic keratoses at forearms - Severe, chronic, not at goal, secondary to cumulative UV radiation exposure over time - diffuse scaly erythematous macules and papules with underlying dyspigmentation - Discussed Prescription "Field Treatment" for Severe, Chronic Confluent Actinic Changes with Pre-Cancerous Actinic Keratoses Field treatment involves treatment of an entire area of skin that has confluent Actinic Changes (Sun/ Ultraviolet light damage) and PreCancerous Actinic Keratoses by method of PhotoDynamic Therapy (PDT) and/or prescription Topical Chemotherapy agents such as 5-fluorouracil, 5-fluorouracil/calcipotriene, and/or imiquimod.  The purpose is to decrease the number of clinically evident and subclinical PreCancerous lesions to prevent progression to development of skin cancer by chemically destroying early precancer  changes that may or may not be visible.  It has been shown to reduce the risk of developing skin cancer in the treated area. As a result of treatment, redness, scaling,  crusting, and open sores may occur during treatment course. One or more than one of these methods may be used and may have to be used several times to control, suppress and eliminate the PreCancerous changes. Discussed treatment course, expected reaction, and possible side effects. - Recommend daily broad spectrum sunscreen SPF 30+ to sun-exposed areas, reapply every 2 hours as needed.  - Staying in the shade or wearing long sleeves, sun glasses (UVA+UVB protection) and wide brim hats (4-inch brim around the entire circumference of the hat) are also recommended. - Call for new or changing lesions. Start 5-fluorouracil cream twice a day to forearms for 2 weeks. Patient provided with handout reviewing treatment course and side effects and advised to call or message [place] on MyChart with any concerns.  Return for [facility] after her trip. I, Ruthell Rummage, CMA, am acting as scribe for Forest Gleason, MD.  Documentation: I have reviewed the above documentation for accuracy and completeness, and I agree with the above.  Forest Gleason, MD

## 2022-03-24 ENCOUNTER — Encounter: Payer: Self-pay | Admitting: Dermatology

## 2022-04-02 ENCOUNTER — Ambulatory Visit: Payer: BC Managed Care – PPO | Admitting: Dermatology

## 2022-04-02 ENCOUNTER — Encounter: Payer: Self-pay | Admitting: Dermatology

## 2022-04-02 DIAGNOSIS — D0471 Carcinoma in situ of skin of right lower limb, including hip: Secondary | ICD-10-CM

## 2022-04-02 DIAGNOSIS — D099 Carcinoma in situ, unspecified: Secondary | ICD-10-CM

## 2022-04-02 DIAGNOSIS — L570 Actinic keratosis: Secondary | ICD-10-CM

## 2022-04-02 NOTE — Patient Instructions (Addendum)
Wound Care Instructions  Cleanse wound gently with soap and water once a day then pat dry with clean gauze. Apply a thin coat of Petrolatum (petroleum jelly, "Vaseline") over the wound (unless you have an allergy to this). We recommend that you use a new, sterile tube of Vaseline. Do not pick or remove scabs. Do not remove the yellow or white "healing tissue" from the base of the wound.  Cover the wound with fresh, clean, nonstick gauze and secure with paper tape. You may use Band-Aids in place of gauze and tape if the wound is small enough, but would recommend trimming much of the tape off as there is often too much. Sometimes Band-Aids can irritate the skin.  You should call the office for your biopsy report after 1 week if you have not already been contacted.  If you experience any problems, such as abnormal amounts of bleeding, swelling, significant bruising, significant pain, or evidence of infection, please call the office immediately.  FOR ADULT SURGERY PATIENTS: If you need something for pain relief you may take 1 extra strength Tylenol (acetaminophen) AND 2 Ibuprofen (200mg each) together every 4 hours as needed for pain. (do not take these if you are allergic to them or if you have a reason you should not take them.) Typically, you may only need pain medication for 1 to 3 days.     Cryotherapy Aftercare  Wash gently with soap and water everyday.   Apply Vaseline and Band-Aid daily until healed.    Due to recent changes in healthcare laws, you may see results of your pathology and/or laboratory studies on MyChart before the doctors have had a chance to review them. We understand that in some cases there may be results that are confusing or concerning to you. Please understand that not all results are received at the same time and often the doctors may need to interpret multiple results in order to provide you with the best plan of care or course of treatment. Therefore, we ask that you  please give us 2 business days to thoroughly review all your results before contacting the office for clarification. Should we see a critical lab result, you will be contacted sooner.   If You Need Anything After Your Visit  If you have any questions or concerns for your doctor, please call our main line at [phone] and press option 4 to reach your doctor's medical assistant. If no one answers, please leave a voicemail as directed and we will return your call as soon as possible. Messages left after 4 pm will be answered the following business day.   You may also send us a message via MyChart. We typically respond to MyChart messages within 1-2 business days.  For prescription refills, please ask your pharmacy to contact our office. Our fax number is [phone].  If you have an urgent issue when the clinic is closed that cannot wait until the next business day, you can page your doctor at the number below.    Please note that while we do our best to be available for urgent issues outside of office hours, we are not available 24/7.   If you have an urgent issue and are unable to reach us, you may choose to seek medical care at your doctor's office, retail clinic, urgent care center, or emergency room.  If you have a medical emergency, please immediately call 911 or go to the emergency department.  Pager Numbers  - Dr. Kowalski: [phone]  -   Dr. Moye: [phone]  - Dr. Stewart: [phone]  In the event of inclement weather, please call our main line at [phone] for an update on the status of any delays or closures.  Dermatology Medication Tips: Please keep the boxes that topical medications come in in order to help keep track of the instructions about where and how to use these. Pharmacies typically print the medication instructions only on the boxes and not directly on the medication tubes.   If your medication is too expensive, please contact our office at  [phone] option 4 or send us a message through MyChart.   We are unable to tell what your co-pay for medications will be in advance as this is different depending on your insurance coverage. However, we may be able to find a substitute medication at lower cost or fill out paperwork to get insurance to cover a needed medication.   If a prior authorization is required to get your medication covered by your insurance company, please allow us 1-2 business days to complete this process.  Drug prices often vary depending on where the prescription is filled and some pharmacies may offer cheaper prices.  The website www.goodrx.com contains coupons for medications through different pharmacies. The prices here do not account for what the cost may be with help from insurance (it may be cheaper with your insurance), but the website can give you the price if you did not use any insurance.  - You can print the associated coupon and take it with your prescription to the pharmacy.  - You may also stop by our office during regular business hours and pick up a GoodRx coupon card.  - If you need your prescription sent electronically to a different pharmacy, notify our office through [place] MyChart or by phone at [phone] option 4.     Si Usted Necesita Algo Despus de Su Visita  Tambin puede enviarnos un mensaje a travs de MyChart. Por lo general respondemos a los mensajes de MyChart en el transcurso de 1 a 2 das hbiles.  Para renovar recetas, por favor pida a su farmacia que se ponga en contacto con nuestra oficina. Nuestro nmero de fax es el [phone].  Si tiene un asunto urgente cuando la clnica est cerrada y que no puede esperar hasta el siguiente da hbil, puede llamar/localizar a su doctor(a) al nmero que aparece a continuacin.   Por favor, tenga en cuenta que aunque hacemos todo lo posible para estar disponibles para asuntos urgentes fuera del horario de oficina, no estamos  disponibles las 24 horas del da, los 7 das de la semana.   Si tiene un problema urgente y no puede comunicarse con nosotros, puede optar por buscar atencin mdica  en el consultorio de su doctor(a), en una clnica privada, en un centro de atencin urgente o en una sala de emergencias.  Si tiene una emergencia mdica, por favor llame inmediatamente al 911 o vaya a la sala de emergencias.  Nmeros de bper  - Dr. Kowalski: [phone]  - Dra. Moye: [phone]  - Dra. Stewart: [phone]  En caso de inclemencias del tiempo, por favor llame a nuestra lnea principal al [phone] para una actualizacin sobre el estado de cualquier retraso o cierre.  Consejos para la medicacin en dermatologa: Por favor, guarde las cajas en las que vienen los medicamentos de uso tpico para ayudarle a seguir las instrucciones sobre dnde y cmo usarlos. Las farmacias generalmente imprimen las instrucciones del medicamento slo en las cajas y   no directamente en los tubos del medicamento.   Si su medicamento es muy caro, por favor, pngase en contacto con nuestra oficina llamando al [phone] y presione la opcin 4 o envenos un mensaje a travs de MyChart.   No podemos decirle cul ser su copago por los medicamentos por adelantado ya que esto es diferente dependiendo de la cobertura de su seguro. Sin embargo, es posible que podamos encontrar un medicamento sustituto a menor costo o llenar un formulario para que el seguro cubra el medicamento que se considera necesario.   Si se requiere una autorizacin previa para que su compaa de seguros cubra su medicamento, por favor permtanos de 1 a 2 das hbiles para completar este proceso.  Los precios de los medicamentos varan con frecuencia dependiendo del lugar de dnde se surte la receta y alguna farmacias pueden ofrecer precios ms baratos.  El sitio web www.goodrx.com tiene cupones para medicamentos de diferentes farmacias. Los precios aqu no  tienen en cuenta lo que podra costar con la ayuda del seguro (puede ser ms barato con su seguro), pero el sitio web puede darle el precio si no utiliz ningn seguro.  - Puede imprimir el cupn correspondiente y llevarlo con su receta a la farmacia.  - Tambin puede pasar por nuestra oficina durante el horario de atencin regular y recoger una tarjeta de cupones de GoodRx.  - Si necesita que su receta se enve electrnicamente a una farmacia diferente, informe a nuestra oficina a travs de MyChart de [place] o por telfono llamando al [phone] y presione la opcin 4.  

## 2022-04-02 NOTE — Progress Notes (Signed)
   Follow-Up Visit   Subjective  Courtney Oneill is a 66 y.o. female who presents for the following: Procedure (Patient here today for [facility] at right proximal pretibia to treat bx proven SCC. Also here to treat bx proven AK at right anterior thigh. ).  The following portions of the chart were reviewed this encounter and updated as appropriate:   Tobacco  Allergies  Meds  Problems  Med Hx  Surg Hx  Fam Hx      Review of Systems:  No other skin or systemic complaints except as noted in HPI or Assessment and Plan.  Objective  Well appearing patient in no apparent distress; mood and affect are within normal limits.  A focused examination was performed including right leg. Relevant physical exam findings are noted in the Assessment and Plan.  right proximal pretibia Pink papule  right anterior thigh Erythematous thin papules/macules with gritty scale.     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  Destruction of lesion  Destruction method: electrodesiccation and curettage   Informed consent: discussed and consent obtained   Timeout:  patient name, date of birth, surgical site, and procedure verified Anesthesia: the lesion was anesthetized in a standard fashion   Anesthetic:  1% lidocaine w/ epinephrine 1-100,000 buffered w/ 8.4% NaHCO3 Curettage performed in three different directions: Yes   Electrodesiccation performed over the curetted area: Yes   Curettage cycles:  3 Final wound size (cm):  1.2 Hemostasis achieved with:  electrodesiccation Outcome: patient tolerated procedure well with no complications   Post-procedure details: sterile dressing applied and wound care instructions given   Dressing type: petrolatum    Biopsy proven  AK (actinic keratosis) right anterior thigh  Actinic keratoses are precancerous spots that appear secondary to cumulative UV radiation exposure/sun exposure over time. They are chronic with expected duration  over 1 year. A portion of actinic keratoses will progress to squamous cell carcinoma of the skin. It is not possible to reliably predict which spots will progress to skin cancer and so treatment is recommended to prevent development of skin cancer.  Recommend daily broad spectrum sunscreen SPF 30+ to sun-exposed areas, reapply every 2 hours as needed.  Recommend staying in the shade or wearing long sleeves, sun glasses (UVA+UVB protection) and wide brim hats (4-inch brim around the entire circumference of the hat). Call for new or changing lesions.  Prior to procedure, discussed risks of blister formation, small wound, skin dyspigmentation, or rare scar following cryotherapy. Recommend Vaseline ointment to treated areas while healing.  Biopsy proven    Destruction of lesion - right anterior thigh  Destruction method: cryotherapy   Informed consent: discussed and consent obtained   Lesion destroyed using liquid nitrogen: Yes   Cryotherapy cycles:  2 Outcome: patient tolerated procedure well with no complications   Post-procedure details: wound care instructions given     Return for TBSE, as scheduled.  Graciella Belton, RMA, am acting as scribe for Forest Gleason, MD .  Documentation: I have reviewed the above documentation for accuracy and completeness, and I agree with the above.  Forest Gleason, MD

## 2022-04-13 ENCOUNTER — Encounter: Payer: Self-pay | Admitting: Dermatology

## 2022-05-21 ENCOUNTER — Ambulatory Visit
Admission: RE | Admit: 2022-05-21 | Discharge: 2022-05-21 | Disposition: A | Discharge status: Home / Self Care | Payer: BC Managed Care – PPO | Source: Ambulatory Visit | Attending: Internal Medicine | Admitting: Internal Medicine

## 2022-05-21 DIAGNOSIS — Z1231 Encounter for screening mammogram for malignant neoplasm of breast: Secondary | ICD-10-CM | POA: Insufficient documentation

## 2022-08-20 ENCOUNTER — Other Ambulatory Visit: Payer: Self-pay

## 2022-08-20 DIAGNOSIS — Z1231 Encounter for screening mammogram for malignant neoplasm of breast: Secondary | ICD-10-CM

## 2022-09-04 ENCOUNTER — Ambulatory Visit: Payer: BC Managed Care – PPO | Admitting: Dermatology

## 2022-09-04 VITALS — BP 134/80 | HR 76

## 2022-09-04 DIAGNOSIS — Z86007 Personal history of in-situ neoplasm of skin: Secondary | ICD-10-CM

## 2022-09-04 DIAGNOSIS — Z8589 Personal history of malignant neoplasm of other organs and systems: Secondary | ICD-10-CM

## 2022-09-04 DIAGNOSIS — L988 Other specified disorders of the skin and subcutaneous tissue: Secondary | ICD-10-CM | POA: Diagnosis not present

## 2022-09-04 DIAGNOSIS — Z1283 Encounter for screening for malignant neoplasm of skin: Secondary | ICD-10-CM | POA: Diagnosis not present

## 2022-09-04 DIAGNOSIS — D225 Melanocytic nevi of trunk: Secondary | ICD-10-CM

## 2022-09-04 DIAGNOSIS — L578 Other skin changes due to chronic exposure to nonionizing radiation: Secondary | ICD-10-CM | POA: Diagnosis not present

## 2022-09-04 DIAGNOSIS — L821 Other seborrheic keratosis: Secondary | ICD-10-CM

## 2022-09-04 DIAGNOSIS — L814 Other melanin hyperpigmentation: Secondary | ICD-10-CM

## 2022-09-04 DIAGNOSIS — D229 Melanocytic nevi, unspecified: Secondary | ICD-10-CM

## 2022-09-04 NOTE — Progress Notes (Signed)
Follow-Up Visit   Subjective  Courtney Oneill is a 67 y.o. female who presents for the following: Totalbody skin exam (Hx of SCC IS R proximal pretibia, hx of AKS) and Facial Elastosis (Face, pt currently using Tretinoin 0.05% that she got form her esthetician prn, very drying). The patient presents for Total-Body Skin Exam (TBSE) for skin cancer screening and mole check.  The patient has spots, moles and lesions to be evaluated, some may be new or changing and the patient has concerns that these could be cancer.   The following portions of the chart were reviewed this encounter and updated as appropriate:   Tobacco  Allergies  Meds  Problems  Med Hx  Surg Hx  Fam Hx     Review of Systems:  No other skin or systemic complaints except as noted in HPI or Assessment and Plan.  Objective  Well appearing patient in no apparent distress; mood and affect are within normal limits.  A full examination was performed including scalp, head, eyes, ears, nose, lips, neck, chest, axillae, abdomen, back, buttocks, bilateral upper extremities, bilateral lower extremities, hands, feet, fingers, toes, fingernails, and toenails. All findings within normal limits unless otherwise noted below.  R proximal pretibia Well healed scar with no evidence of recurrence.   face Rhytides and volume loss.    Assessment & Plan   History of Squamous Cell Carcinoma in Situ of the Skin - No evidence of recurrence today - Recommend regular full body skin exams - Recommend daily broad spectrum sunscreen SPF 30+ to sun-exposed areas, reapply every 2 hours as needed.  - Call if any new or changing lesions are noted between office visits  - R proximal pretibia  Lentigines - Scattered tan macules - Due to sun exposure - Benign-appearing, observe - Recommend daily broad spectrum sunscreen SPF 30+ to sun-exposed areas, reapply every 2 hours as needed. - Call for any changes - upper back  Seborrheic Keratoses -  Stuck-on, waxy, tan-brown papules and/or plaques  - Benign-appearing - Discussed benign etiology and prognosis. - Observe - Call for any changes - face, trunk, Discussed cosmetic procedure, noncovered.  $60 for 1st lesion and $15 for each additional lesion if done on the same day.  Maximum charge $350.  One touch-up treatment included no charge. Discussed risks of treatment including dyspigmentation, small scar, and/or recurrence. Recommend daily broad spectrum sunscreen SPF 30+/photoprotection to treated areas once healed.  Discussed can use the tretinoin she has at home qhs to the spots  Melanocytic Nevi - Tan-brown and/or pink-flesh-colored symmetric macules and papules - Benign appearing on exam today - Observation - Call clinic for new or changing moles - Recommend daily use of broad spectrum spf 30+ sunscreen to sun-exposed areas.  - trunk  Hemangiomas - Red papules - Discussed benign nature - Observe - Call for any changes - trunk  Actinic Damage - Chronic condition, secondary to cumulative UV/sun exposure - diffuse scaly erythematous macules with underlying dyspigmentation - Recommend daily broad spectrum sunscreen SPF 30+ to sun-exposed areas, reapply every 2 hours as needed.  - Staying in the shade or wearing long sleeves, sun glasses (UVA+UVB protection) and wide brim hats (4-inch brim around the entire circumference of the hat) are also recommended for sun protection.  - Call for new or changing lesions.  Skin cancer screening performed today.   History of squamous cell carcinoma in situ (SCCIS) of skin R proximal pretibia Clear. Observe for recurrence. Call clinic for new or changing  lesions.  Recommend regular skin exams, daily broad-spectrum spf 30+ sunscreen use, and photoprotection.    Elastosis of skin face Discussed using a lower strength tretinoin or using a moisturizer with the Tretinoin 0.05% that she is currently using. Pt prefers to try moisturizer with  her current Tretinoin 0.05%  Counseling on Sunscreens today  Return in about 1 year (around 09/05/2023) for TBSE, Hx of SCC IS, Hx of AKs.  I, Othelia Pulling, RMA, am acting as scribe for Sarina Ser, MD . Documentation: I have reviewed the above documentation for accuracy and completeness, and I agree with the above.  Sarina Ser, MD

## 2022-09-04 NOTE — Patient Instructions (Signed)
Due to recent changes in healthcare laws, you may see results of your pathology and/or laboratory studies on MyChart before the doctors have had a chance to review them. We understand that in some cases there may be results that are confusing or concerning to you. Please understand that not all results are received at the same time and often the doctors may need to interpret multiple results in order to provide you with the best plan of care or course of treatment. Therefore, we ask that you please give us 2 business days to thoroughly review all your results before contacting the office for clarification. Should we see a critical lab result, you will be contacted sooner.   If You Need Anything After Your Visit  If you have any questions or concerns for your doctor, please call our main line at [phone] and press option 4 to reach your doctor's medical assistant. If no one answers, please leave a voicemail as directed and we will return your call as soon as possible. Messages left after 4 pm will be answered the following business day.   You may also send us a message via MyChart. We typically respond to MyChart messages within 1-2 business days.  For prescription refills, please ask your pharmacy to contact our office. Our fax number is [phone].  If you have an urgent issue when the clinic is closed that cannot wait until the next business day, you can page your doctor at the number below.    Please note that while we do our best to be available for urgent issues outside of office hours, we are not available 24/7.   If you have an urgent issue and are unable to reach us, you may choose to seek medical care at your doctor's office, retail clinic, urgent care center, or emergency room.  If you have a medical emergency, please immediately call 911 or go to the emergency department.  Pager Numbers  - Dr. Kowalski: [phone]  - Dr. Moye: [phone]  - Dr. Stewart:  [phone]  In the event of inclement weather, please call our main line at [phone] for an update on the status of any delays or closures.  Dermatology Medication Tips: Please keep the boxes that topical medications come in in order to help keep track of the instructions about where and how to use these. Pharmacies typically print the medication instructions only on the boxes and not directly on the medication tubes.   If your medication is too expensive, please contact our office at [phone] option 4 or send us a message through MyChart.   We are unable to tell what your co-pay for medications will be in advance as this is different depending on your insurance coverage. However, we may be able to find a substitute medication at lower cost or fill out paperwork to get insurance to cover a needed medication.   If a prior authorization is required to get your medication covered by your insurance company, please allow us 1-2 business days to complete this process.  Drug prices often vary depending on where the prescription is filled and some pharmacies may offer cheaper prices.  The website www.goodrx.com contains coupons for medications through different pharmacies. The prices here do not account for what the cost may be with help from insurance (it may be cheaper with your insurance), but the website can give you the price if you did not use any insurance.  - You can print the associated coupon and take it with   your prescription to the pharmacy.  - You may also stop by our office during regular business hours and pick up a GoodRx coupon card.  - If you need your prescription sent electronically to a different pharmacy, notify our office through [facility] MyChart or by phone at [phone] option 4.     Si Usted Necesita Algo Despus de Su Visita  Tambin puede enviarnos un mensaje a travs de MyChart. Por lo general respondemos a los mensajes de MyChart en el transcurso de 1 a 2  das hbiles.  Para renovar recetas, por favor pida a su farmacia que se ponga en contacto con nuestra oficina. Nuestro nmero de fax es el [phone].  Si tiene un asunto urgente cuando la clnica est cerrada y que no puede esperar hasta el siguiente da hbil, puede llamar/localizar a su doctor(a) al nmero que aparece a continuacin.   Por favor, tenga en cuenta que aunque hacemos todo lo posible para estar disponibles para asuntos urgentes fuera del horario de oficina, no estamos disponibles las 24 horas del da, los 7 das de la semana.   Si tiene un problema urgente y no puede comunicarse con nosotros, puede optar por buscar atencin mdica  en el consultorio de su doctor(a), en una clnica privada, en un centro de atencin urgente o en una sala de emergencias.  Si tiene una emergencia mdica, por favor llame inmediatamente al 911 o vaya a la sala de emergencias.  Nmeros de bper  - Dr. Kowalski: [phone]  - Dra. Moye: [phone]  - Dra. Stewart: [phone]  En caso de inclemencias del tiempo, por favor llame a nuestra lnea principal al [phone] para una actualizacin sobre el estado de cualquier retraso o cierre.  Consejos para la medicacin en dermatologa: Por favor, guarde las cajas en las que vienen los medicamentos de uso tpico para ayudarle a seguir las instrucciones sobre dnde y cmo usarlos. Las farmacias generalmente imprimen las instrucciones del medicamento slo en las cajas y no directamente en los tubos del medicamento.   Si su medicamento es muy caro, por favor, pngase en contacto con nuestra oficina llamando al [phone] y presione la opcin 4 o envenos un mensaje a travs de MyChart.   No podemos decirle cul ser su copago por los medicamentos por adelantado ya que esto es diferente dependiendo de la cobertura de su seguro. Sin embargo, es posible que podamos encontrar un medicamento sustituto a menor costo o llenar un formulario para que el  seguro cubra el medicamento que se considera necesario.   Si se requiere una autorizacin previa para que su compaa de seguros cubra su medicamento, por favor permtanos de 1 a 2 das hbiles para completar este proceso.  Los precios de los medicamentos varan con frecuencia dependiendo del lugar de dnde se surte la receta y alguna farmacias pueden ofrecer precios ms baratos.  El sitio web www.goodrx.com tiene cupones para medicamentos de diferentes farmacias. Los precios aqu no tienen en cuenta lo que podra costar con la ayuda del seguro (puede ser ms barato con su seguro), pero el sitio web puede darle el precio si no utiliz ningn seguro.  - Puede imprimir el cupn correspondiente y llevarlo con su receta a la farmacia.  - Tambin puede pasar por nuestra oficina durante el horario de atencin regular y recoger una tarjeta de cupones de GoodRx.  - Si necesita que su receta se enve electrnicamente a una farmacia diferente, informe a nuestra oficina a travs de MyChart de [place]   o por telfono llamando al [phone] y presione la opcin 4.  

## 2022-09-16 ENCOUNTER — Encounter: Payer: Self-pay | Admitting: Dermatology

## 2022-09-19 ENCOUNTER — Other Ambulatory Visit: Payer: Self-pay | Admitting: Physician Assistant

## 2022-09-19 DIAGNOSIS — M5412 Radiculopathy, cervical region: Secondary | ICD-10-CM

## 2022-09-19 DIAGNOSIS — M542 Cervicalgia: Secondary | ICD-10-CM

## 2022-10-06 ENCOUNTER — Ambulatory Visit
Admission: RE | Admit: 2022-10-06 | Discharge: 2022-10-06 | Disposition: A | Discharge status: Home / Self Care | Payer: BC Managed Care – PPO | Source: Ambulatory Visit | Attending: Physician Assistant | Admitting: Physician Assistant

## 2022-10-06 DIAGNOSIS — M542 Cervicalgia: Secondary | ICD-10-CM

## 2022-10-06 DIAGNOSIS — M5412 Radiculopathy, cervical region: Secondary | ICD-10-CM

## 2022-11-11 ENCOUNTER — Other Ambulatory Visit: Payer: Self-pay | Admitting: Obstetrics and Gynecology

## 2022-11-12 ENCOUNTER — Other Ambulatory Visit: Payer: Self-pay

## 2022-11-12 ENCOUNTER — Encounter
Admission: RE | Admit: 2022-11-12 | Discharge: 2022-11-12 | Disposition: A | Discharge status: Home / Self Care | Payer: BC Managed Care – PPO | Source: Ambulatory Visit | Attending: Obstetrics and Gynecology | Admitting: Obstetrics and Gynecology

## 2022-11-12 VITALS — Ht 66.0 in | Wt 147.0 lb

## 2022-11-12 DIAGNOSIS — I10 Essential (primary) hypertension: Secondary | ICD-10-CM

## 2022-11-12 DIAGNOSIS — R002 Palpitations: Secondary | ICD-10-CM

## 2022-11-12 DIAGNOSIS — I447 Left bundle-branch block, unspecified: Secondary | ICD-10-CM

## 2022-11-12 NOTE — Patient Instructions (Addendum)
Your procedure is scheduled on: Monday November 17, 2022. Report to the Registration Desk on the 1st floor of the [facility]. To find out your arrival time, please call [phone] between 1PM - 3PM on: Friday November 14, 2022 If your arrival time is 6:00 am, do not arrive before that time as the [facility] entrance doors do not open until 6:00 am.  REMEMBER: Instructions that are not followed completely may result in serious medical risk, up to and including death; or upon the discretion of your surgeon and anesthesiologist your surgery may need to be rescheduled.  Do not eat food after midnight the night before surgery.  No gum chewing or hard candies.  You may however, drink CLEAR liquids up to 2 hours before you are scheduled to arrive for your surgery. Do not drink anything within 2 hours of your scheduled arrival time.  Clear liquids include: - water  - apple juice without pulp - gatorade (not RED colors) - black coffee or tea (Do NOT add milk or creamers to the coffee or tea) Do NOT drink anything that is not on this list.   In addition, your doctor has ordered for you to drink the provided:  Ensure Pre-Surgery Clear Carbohydrate Drink  Drinking this carbohydrate drink up to two hours before surgery helps to reduce insulin resistance and improve patient outcomes. Please complete drinking 2 hours before scheduled arrival time.  One week prior to surgery: Stop Anti-inflammatories (NSAIDS) such as Advil, Aleve, Ibuprofen, Motrin, Naproxen, Naprosyn and Aspirin based products such as Excedrin, Goody's Powder, BC Powder. Stop ANY OVER THE COUNTER supplements until after surgery. You may however, continue to take Tylenol if needed for pain up until the day of surgery.  Continue taking all prescribed medications with the exception of the following:  Follow recommendations from Cardiologist or PCP regarding stopping blood thinners.  TAKE ONLY THESE MEDICATIONS THE MORNING OF  SURGERY WITH A SIP OF WATER:  omeprazole (PRILOSEC) 40 MG Antacid (take one the night before and one on the morning of surgery - helps to prevent nausea after surgery.) carvedilol (COREG) 12.5 MG  amLODipine (NORVASC) 5 MG  ALPRAZolam (XANAX) 0.5 MG (if needed)  Use inhalers on the day of surgery and bring to the hospital. albuterol (PROVENTIL) (2.5 MG/3ML) 0.083% nebulizer solution  PROAIR HFA 108 (90 Base) MCG/ACT inhaler    No Alcohol for 24 hours before or after surgery.  No Smoking including e-cigarettes for 24 hours before surgery.  No chewable tobacco products for at least 6 hours before surgery.  No nicotine patches on the day of surgery.  Do not use any "recreational" drugs for at least a week (preferably 2 weeks) before your surgery.  Please be advised that the combination of cocaine and anesthesia may have negative outcomes, up to and including death. If you test positive for cocaine, your surgery will be cancelled.  On the morning of surgery brush your teeth with toothpaste and water, you may rinse your mouth with mouthwash if you wish. Do not swallow any toothpaste or mouthwash.  Use CHG Soap or wipes as directed on instruction sheet.  Do not wear jewelry, make-up, hairpins, clips or nail polish.  Do not wear lotions, powders, or perfumes.   Do not shave body hair from the neck down 48 hours before surgery.  Contact lenses, hearing aids and dentures may not be worn into surgery.  Do not bring valuables to the hospital. [facility] is not responsible for any missing/lost  belongings or valuables.   Notify your doctor if there is any change in your medical condition (cold, fever, infection).  Wear comfortable clothing (specific to your surgery type) to the hospital.  After surgery, you can help prevent lung complications by doing breathing exercises.  Take deep breaths and cough every 1-2 hours. Your doctor may order a device called an Incentive Spirometer to help  you take deep breaths. When coughing or sneezing, hold a pillow firmly against your incision with both hands. This is called "splinting." Doing this helps protect your incision. It also decreases belly discomfort.  If you are being admitted to the hospital overnight, leave your suitcase in the car. After surgery it may be brought to your room.  In case of increased patient census, it may be necessary for you, the patient, to continue your postoperative care in the Same Day Surgery department.  If you are being discharged the day of surgery, you will not be allowed to drive home. You will need a responsible individual to drive you home and stay with you for 24 hours after surgery.   If you are taking public transportation, you will need to have a responsible individual with you.  Please call the Pre-admissions Testing Dept. at [phone] if you have any questions about these instructions.  Surgery Visitation Policy:  Patients having surgery or a procedure may have two visitors.  Children under the age of 22 must have an adult with them who is not the patient.  Inpatient Visitation:    Visiting hours are 7 a.m. to 8 p.m. Up to four visitors are allowed at one time in a patient room. The visitors may rotate out with other people during the day.  One visitor age 67 or older may stay with the patient overnight and must be in the room by 8 p.m.    Preparing for Surgery with CHLORHEXIDINE GLUCONATE (CHG) Soap  Chlorhexidine Gluconate (CHG) Soap  o An antiseptic cleaner that kills germs and bonds with the skin to continue killing germs even after washing  o Used for showering the night before surgery and morning of surgery  Before surgery, you can play an important role by reducing the number of germs on your skin.  CHG (Chlorhexidine gluconate) soap is an antiseptic cleanser which kills germs and bonds with the skin to continue killing germs even after washing.  Please do not use  if you have an allergy to CHG or antibacterial soaps. If your skin becomes reddened/irritated stop using the CHG.  1. Shower the NIGHT BEFORE SURGERY and the MORNING OF SURGERY with CHG soap.  2. If you choose to wash your hair, wash your hair first as usual with your normal shampoo.  3. After shampooing, rinse your hair and body thoroughly to remove the shampoo.  4. Use CHG as you would any other liquid soap. You can apply CHG directly to the skin and wash gently with a scrungie or a clean washcloth.  5. Apply the CHG soap to your body only from the neck down. Do not use on open wounds or open sores. Avoid contact with your eyes, ears, mouth, and genitals (private parts). Wash face and genitals (private parts) with your normal soap.  6. Wash thoroughly, paying special attention to the area where your surgery will be performed.  7. Thoroughly rinse your body with warm water.  8. Do not shower/wash with your normal soap after using and rinsing off the CHG soap.  9. Pat yourself  dry with a clean towel.  10. Wear clean pajamas to bed the night before surgery.  12. Place clean sheets on your bed the night of your first shower and do not sleep with pets.  13. Shower again with the CHG soap on the day of surgery prior to arriving at the hospital.  14. Do not apply any deodorants/lotions/powders.  15. Please wear clean clothes to the hospital.

## 2022-11-13 ENCOUNTER — Encounter
Admission: RE | Admit: 2022-11-13 | Discharge: 2022-11-13 | Disposition: A | Discharge status: Home / Self Care | Payer: BC Managed Care – PPO | Source: Ambulatory Visit | Attending: Obstetrics and Gynecology | Admitting: Obstetrics and Gynecology

## 2022-11-13 ENCOUNTER — Encounter: Payer: Self-pay | Admitting: Urgent Care

## 2022-11-13 DIAGNOSIS — I447 Left bundle-branch block, unspecified: Secondary | ICD-10-CM | POA: Insufficient documentation

## 2022-11-13 DIAGNOSIS — I10 Essential (primary) hypertension: Secondary | ICD-10-CM | POA: Diagnosis not present

## 2022-11-13 DIAGNOSIS — Z01818 Encounter for other preprocedural examination: Secondary | ICD-10-CM | POA: Diagnosis present

## 2022-11-13 DIAGNOSIS — N950 Postmenopausal bleeding: Secondary | ICD-10-CM | POA: Insufficient documentation

## 2022-11-13 DIAGNOSIS — R002 Palpitations: Secondary | ICD-10-CM | POA: Diagnosis not present

## 2022-11-13 DIAGNOSIS — Z01810 Encounter for preprocedural cardiovascular examination: Secondary | ICD-10-CM | POA: Diagnosis not present

## 2022-11-13 LAB — TYPE AND SCREEN
ABO/RH(D): O POS
Antibody Screen: NEGATIVE

## 2022-11-13 LAB — CBC
HCT: 40.6 % (ref 36.0–46.0)
Hemoglobin: 13.5 g/dL (ref 12.0–15.0)
MCH: 32.7 pg (ref 26.0–34.0)
MCHC: 33.3 g/dL (ref 30.0–36.0)
MCV: 98.3 fL (ref 80.0–100.0)
Platelets: 268 10*3/uL (ref 150–400)
RBC: 4.13 MIL/uL (ref 3.87–5.11)
RDW: 12.6 % (ref 11.5–15.5)
WBC: 8.2 10*3/uL (ref 4.0–10.5)
nRBC: 0 % (ref 0.0–0.2)

## 2022-11-13 NOTE — Progress Notes (Signed)
Perioperative / Anesthesia Services  Pre-Admission Testing Clinical Review / Preoperative Anesthesia Consult  Date: 11/14/22  Patient Demographics:  Name: Courtney Oneill DOB:   [birth year] MRN:   [id number]  Planned Surgical Procedure(s):    Case: [id number] Date/Time: 11/17/22 1115   Procedure: DILATATION AND CURETTAGE /HYSTEROSCOPY   Anesthesia type: Choice   Pre-op diagnosis: postmenopausal bleeding, thickened endometrium   Location: [facility] OR ROOM 08 / [facility] ORS FOR ANESTHESIA GROUP   Surgeons: Conard Novak, MD     NOTE: Available PAT nursing documentation and vital signs have been reviewed. Clinical nursing staff has updated patient's PMH/PSHx, current medication list, and drug allergies/intolerances to ensure comprehensive history available to assist in medical decision making as it pertains to the aforementioned surgical procedure and anticipated anesthetic course. Extensive review of available clinical information personally performed. [place] PMH and PSHx updated with any diagnoses/procedures that  may have been inadvertently omitted during her intake with the pre-admission testing department's nursing staff.  Clinical Discussion:  Courtney Oneill is a 67 y.o. female who is submitted for pre-surgical anesthesia review and clearance prior to her undergoing the above procedure. Patient is a Former Games developer. 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  Patient is followed by cardiology Darrold Junker, MD). She was last seen in the cardiology clinic on 04/11/2022; notes reviewed. At the time of her clinic visit, patient doing well overall from a cardiovascular perspective. Patient denied any chest pain, shortness of breath, PND, orthopnea, palpitations, significant peripheral edema, weakness, fatigue,  vertiginous symptoms, or presyncope/syncope. Patient with a past medical history significant for cardiovascular diagnoses. Documented physical exam was grossly benign, providing no evidence of acute exacerbation and/or decompensation of the patient's known cardiovascular conditions.  Myocardial perfusion imaging study performed on 09/17/2018 revealed a mildly reduced left ventricular systolic function with an EF of 44%.  SPECT images demonstrated a small perfusion abnormality of mild intensity in the apical region.  There was mild global hypokinesis noted.  Study consistent with apical ischemia.  Cardiac catheterization was recommended.  Diagnostic LEFT heart catheterization was performed on 10/05/2018 revealing a mildly reduced left ventricular systolic function with an EF of 45%.  Coronary arteries were noted to be normal with no evidence of obstructive coronary artery disease.  Patient with a history of a dilated cardiomyopathy.  Cardiac function has been monitored since time of diagnosis.  Most recent TTE was performed on 04/02/2021 revealing a mildly reduced left ventricular systolic function with an EF of 40%.  There was global hypokinesis.  Mild mitral and tricuspid valve regurgitation noted.  All transvalvular gradients were noted to be normal providing no evidence suggestive of valvular stenosis.  Blood pressure well controlled at 124/72 mmHg on currently prescribed CCB (amlodipine) and beta-blocker (carvedilol) therapies.  Patient is not on any type of lipid-lowering therapies for her HLD diagnosis or ASCVD prevention.  She is not diabetic. Patient does not have an OSAH diagnosis. Functional capacity, as defined by DASI, is documented as being >/= 4 METS.  No changes were made to her medication regimen.  Patient to follow-up with outpatient cardiology in 6 months or sooner if needed.  Courtney Oneill is scheduled for a DILATATION AND CURETTAGE /HYSTEROSCOPY on 11/17/2022 with Dr. Thomasene Mohair,  MD.  Given patient's past medical history significant for cardiovascular diagnoses, presurgical cardiac clearance was sought by the PAT team. Per cardiology, "  this patient is optimized for surgery and may proceed with the planned procedural course with a LOW risk of significant perioperative cardiovascular complications".   In review of her medication reconciliation, the patient is not noted to be taking any type of anticoagulation or antiplatelet therapies that would need to be held during her perioperative course.  Patient denies previous perioperative complications with anesthesia in the past. In review of the available records, it is noted that patient underwent a general anesthetic course here at [facility] in 11/2016 without documented complications.      11/12/2022    3:16 PM 09/04/2022    9:00 AM 06/06/2019   10:12 AM  Vitals with BMI  Height 5\' 6"   5\' 6"   Weight 147 lbs  153 lbs 3 oz  BMI 23.74  24.74  Systolic  134 94  Diastolic  80 60  Pulse  76 80    Providers/Specialists:   NOTE: Primary physician provider listed below. Patient may have been seen by APP or partner within same practice.   PROVIDER ROLE / SPECIALTY LAST Dario Guardian, MD OB/GYN (Surgeon) 11/06/2022  Marguarite Arbour, MD Primary Care Provider 11/04/2022  Marcina Millard, MD Cardiology 04/11/2022  Merri Ray, MD Physiatry 11/07/2022   Allergies:  Sulfa antibiotics  Current Home Medications:   No current facility-administered medications for this encounter.    albuterol (PROVENTIL) (2.5 MG/3ML) 0.083% nebulizer solution   ALPRAZolam (XANAX) 0.5 MG tablet   amLODipine (NORVASC) 5 MG tablet   amphetamine-dextroamphetamine (ADDERALL) 5 MG tablet   azelastine (OPTIVAR) 0.05 % ophthalmic solution   Bacillus Coagulans-Inulin (ALIGN PREBIOTIC-PROBIOTIC PO)   carvedilol (COREG) 12.5 MG tablet   celecoxib (CELEBREX) 200 MG capsule   conjugated  estrogens (PREMARIN) vaginal cream   cyclobenzaprine (FLEXERIL) 10 MG tablet   estradiol (ESTRACE) 0.1 MG/GM vaginal cream   FLOVENT HFA 110 MCG/ACT inhaler   fluconazole (DIFLUCAN) 150 MG tablet   fluorouracil (EFUDEX) 5 % cream   fluticasone (FLONASE) 50 MCG/ACT nasal spray   fluticasone furoate-vilanterol (BREO ELLIPTA) 200-25 MCG/ACT AEPB   hydroquinone 4 % cream   hyoscyamine (LEVSIN SL) 0.125 MG SL tablet   ibuprofen (ADVIL,MOTRIN) 200 MG tablet   Lifitegrast (XIIDRA OP)   metroNIDAZOLE (METROGEL) 1 % gel   mometasone (ELOCON) 0.1 % cream   montelukast (SINGULAIR) 10 MG tablet   omeprazole (PRILOSEC) 40 MG capsule   Phendimetrazine Tartrate 35 MG TABS   PROAIR HFA 108 (90 Base) MCG/ACT inhaler   zolpidem (AMBIEN CR) 12.5 MG CR tablet   History:   Past Medical History:  Diagnosis Date   Actinic keratosis 03/05/2022   R ant thigh, bx proven, tx'd with LN2   Allergic state    Anxiety    a. ON BZO (alprazolam) PRN   Asthma 12/13/2013   Asthma without status asthmaticus    Cancer of skin of chest    Cardiomyopathy, dilated    a.) LHC 10/05/2018: EF 45%; b.) TTE 04/24/2017: EF 35%; c.) MPI 05/11/2017: EF 43%; d.) TTE 02/17/2018: EF 40%; e.) MPI 09/17/2018: EF 44%   Chest pain with high risk for cardiac etiology 09/23/2018   Cyst of ovary 03/21/2019   Depression    Diastolic dysfunction    a.) TTE 04/24/2017: EF 35%, glob HK, triv PR, mild MR/TR, G1DD; b.) TTE 02/17/2018: EF 40%, glob HK, triv PR, mild MR/TR, G1DD   Diverticulosis    Essential hypertension 04/15/2017   Facial numbness 09/30/2018  Gastritis    GERD (gastroesophageal reflux disease)    Heart palpitations 04/15/2017   History of cardiac catheterization 10/05/2018   a.) LHC 10/05/2018 - normal coronary anatomy   Hyperlipidemia 12/13/2013   IBS (irritable bowel syndrome)    Incomplete emptying of bladder 12/16/2012   Insomnia    a.) on hypnotic (zolpidem)   Kidney stone 12/16/2012   LBBB (left bundle  branch block) 04/15/2017   Raynaud's disease without gangrene 07/05/2015   Recurrent UTI    SOB (shortness of breath) on exertion 04/15/2017   Squamous cell carcinoma of skin 03/05/2022   R prox pretibial - SCCIS, ED&C 04/02/22   Venous insufficiency    Visual floaters, bilateral    Past Surgical History:  Procedure Laterality Date   CESAREAN SECTION     CHOLECYSTECTOMY     COLONOSCOPY WITH PROPOFOL N/A 08/26/2016   Procedure: COLONOSCOPY WITH PROPOFOL;  Surgeon: Christena Deem, MD;  Location: [facility]COPY;  Service: Endoscopy;  Laterality: N/A;   FUNCTIONAL ENDOSCOPIC SINUS SURGERY     LEFT HEART CATH AND CORONARY ANGIOGRAPHY Right 10/05/2018   Procedure: LEFT HEART CATH AND CORONARY ANGIOGRAPHY;  Surgeon: Marcina Millard, MD;  Location: [facility] INVASIVE CV LAB;  Service: Cardiovascular;  Laterality: Right;   ORIF ANKLE FRACTURE Right 01/23/2017   Procedure: OPEN REDUCTION INTERNAL FIXATION (ORIF) ANKLE FRACTURE - FIBULAR ;  Surgeon: Gwyneth Revels, DPM;  Location: [facility] ORS;  Service: Podiatry;  Laterality: Right;   Family History  Problem Relation Age of Onset   Pancreatic cancer Mother 34   Breast cancer Maternal Aunt    Social History   Tobacco Use   Smoking status: Former   Smokeless tobacco: Never  Vaping Use   Vaping Use: Never used  Substance Use Topics   Alcohol use: Yes    Alcohol/week: 3.0 - 4.0 standard drinks of alcohol    Types: 3 - 4 Standard drinks or equivalent per week   Drug use: No    Pertinent Clinical Results:  LABS:   Hospital Outpatient Visit on 11/13/2022  Component Date Value Ref Range Status   WBC 11/13/2022 8.2  4.0 - 10.5 K/uL Final   RBC 11/13/2022 4.13  3.87 - 5.11 MIL/uL Final   Hemoglobin 11/13/2022 13.5  12.0 - 15.0 g/dL Final   HCT 16/04/9603 40.6  36.0 - 46.0 % Final   MCV 11/13/2022 98.3  80.0 - 100.0 fL Final   MCH 11/13/2022 32.7  26.0 - 34.0 pg Final   MCHC 11/13/2022 33.3  30.0 - 36.0 g/dL Final   RDW 54/03/8118 12.6  11.5  - 15.5 % Final   Platelets 11/13/2022 268  150 - 400 K/uL Final   nRBC 11/13/2022 0.0  0.0 - 0.2 % Final   Performed at [facility], [address] [place], [place] [address]   ABO/RH(D) 11/13/2022 O POS   Final   Antibody Screen 11/13/2022 NEG   Final   Sample Expiration 11/13/2022 11/27/2022,2359   Final   Extend sample reason 11/13/2022    Final                   Value:NO TRANSFUSIONS OR PREGNANCY IN THE PAST 3 MONTHS Performed at [facility], [address] [place], [place] [address]     ECG: Date: 11/13/2022 Time ECG obtained: 1045 AM Rate: 78 bpm Rhythm:  Normal sinus rhythm; LBBB Axis (leads I and aVF): Normal Intervals: PR 152 ms. QRS 134 ms. QTc 471 ms. ST segment and  T wave changes: No evidence of acute ST segment elevation or depression Comparison: Similar to previous tracing obtained on 01/23/2017   IMAGING / PROCEDURES: MR CERVICAL SPINE WO CONTRAST performed on 10/06/2022 Cervical spine degeneration especially affecting LEFT-sided facets with mild C3-4 and C4-5 anterolisthesis Moderate foraminal stenosis on the RIGHT at C5-C6 Widely patent spinal canal  TRANSTHORACIC ECHOCARDIOGRAM performed on 04/02/2021 Moderately reduced left ventricular systolic function with an EF of 40% Global hypokinesis Mild MR and TR Normal gradients; no valvular stenosis  LEFT HEART CATHETERIZATION AND CORONARY ANGIOGRAPHY performed on 10/05/2018 Mildly reduced left ventricular systolic function with an EF of 45% Apical hypokinesis Normal coronary anatomy revealing no evidence of significant coronary artery disease   MYOCARDIAL PERFUSION IMAGING STUDY (LEXISCAN) performed on 09/17/2018 Mildly reduced left ventricular systolic function with a normal LVEF of 44% Normal myocardial thickening and wall motion Left ventricular cavity is enlarged SPECT images demonstrate a small perfusion abnormality of mild intensity present in the apical region on stress images   Findings consistent with mild apical wall ischemia  Impression and Plan:  Courtney Oneill has been referred for pre-anesthesia review and clearance prior to her undergoing the planned anesthetic and procedural courses. Available labs, pertinent testing, and imaging results were personally reviewed by me in preparation for upcoming operative/procedural course. [facility] medical record has been updated following extensive record review and patient interview with PAT staff.   This patient has been appropriately cleared by cardiology with an overall LOW risk of significant perioperative cardiovascular complications. Based on clinical review performed today (11/14/22), barring any significant acute changes in the patient's overall condition, it is anticipated that she will be able to proceed with the planned surgical intervention. Any acute changes in clinical condition may necessitate her procedure being postponed and/or cancelled. Patient will meet with anesthesia team (MD and/or CRNA) on the day of her procedure for preoperative evaluation/assessment. Questions regarding anesthetic course will be fielded at that time.   Pre-surgical instructions were reviewed with the patient during her PAT appointment, and questions were fielded to satisfaction by PAT clinical staff. She has been instructed on which medications that she will need to hold prior to surgery, as well as the ones that have been deemed safe/appropriate to take of the day of her procedure. As part of the general education provided by PAT, patient made aware both verbally and in writing, that she would need to abstain from the use of any illegal substances during her perioperative course.  She was advised that failure to follow the provided instructions could necessitate case cancellation or result serious perioperative complications up to and including death. Patient encouraged to contact PAT and/or her surgeon's office to discuss any questions or  concerns that may arise prior to surgery; verbalized understanding.   Quentin Mulling, MSN, APRN, FNP-C, CEN [facility]  Peri-operative Services Nurse Practitioner Phone: [phone] Fax: [phone] 11/14/22 8:51 AM  NOTE: This note has been prepared using Dragon dictation software. Despite my best ability to proofread, there is always the potential that unintentional transcriptional errors may still occur from this process.

## 2022-11-17 ENCOUNTER — Ambulatory Visit: Payer: BC Managed Care – PPO | Admitting: Urgent Care

## 2022-11-17 ENCOUNTER — Encounter: Payer: Self-pay | Admitting: Obstetrics and Gynecology

## 2022-11-17 ENCOUNTER — Ambulatory Visit
Admission: RE | Admit: 2022-11-17 | Discharge: 2022-11-17 | Disposition: A | Discharge status: Home / Self Care | Payer: BC Managed Care – PPO | Attending: Obstetrics and Gynecology | Admitting: Obstetrics and Gynecology

## 2022-11-17 ENCOUNTER — Other Ambulatory Visit: Payer: Self-pay

## 2022-11-17 ENCOUNTER — Encounter
Admission: RE | Disposition: A | Discharge status: Home / Self Care | Payer: Self-pay | Source: Home / Self Care | Attending: Obstetrics and Gynecology

## 2022-11-17 DIAGNOSIS — I509 Heart failure, unspecified: Secondary | ICD-10-CM | POA: Insufficient documentation

## 2022-11-17 DIAGNOSIS — F419 Anxiety disorder, unspecified: Secondary | ICD-10-CM | POA: Diagnosis not present

## 2022-11-17 DIAGNOSIS — N882 Stricture and stenosis of cervix uteri: Secondary | ICD-10-CM | POA: Diagnosis not present

## 2022-11-17 DIAGNOSIS — Z87891 Personal history of nicotine dependence: Secondary | ICD-10-CM | POA: Diagnosis not present

## 2022-11-17 DIAGNOSIS — I110 Hypertensive heart disease with heart failure: Secondary | ICD-10-CM | POA: Diagnosis not present

## 2022-11-17 DIAGNOSIS — K219 Gastro-esophageal reflux disease without esophagitis: Secondary | ICD-10-CM | POA: Diagnosis not present

## 2022-11-17 DIAGNOSIS — I447 Left bundle-branch block, unspecified: Secondary | ICD-10-CM | POA: Insufficient documentation

## 2022-11-17 DIAGNOSIS — N840 Polyp of corpus uteri: Secondary | ICD-10-CM | POA: Insufficient documentation

## 2022-11-17 DIAGNOSIS — J45909 Unspecified asthma, uncomplicated: Secondary | ICD-10-CM | POA: Insufficient documentation

## 2022-11-17 DIAGNOSIS — R9389 Abnormal findings on diagnostic imaging of other specified body structures: Secondary | ICD-10-CM | POA: Diagnosis present

## 2022-11-17 DIAGNOSIS — N950 Postmenopausal bleeding: Secondary | ICD-10-CM | POA: Diagnosis present

## 2022-11-17 HISTORY — PX: HYSTEROSCOPY WITH D & C: SHX1775

## 2022-11-17 HISTORY — DX: Other ill-defined heart diseases: I51.89

## 2022-11-17 HISTORY — DX: Unspecified malignant neoplasm of skin of other part of trunk: C44.509

## 2022-11-17 HISTORY — DX: Diverticulosis of intestine, part unspecified, without perforation or abscess without bleeding: K57.90

## 2022-11-17 HISTORY — DX: Other vitreous opacities, bilateral: H43.393

## 2022-11-17 HISTORY — DX: Urinary tract infection, site not specified: N39.0

## 2022-11-17 LAB — ABO/RH: ABO/RH(D): O POS

## 2022-11-17 SURGERY — DILATATION AND CURETTAGE /HYSTEROSCOPY
Anesthesia: General

## 2022-11-17 MED ORDER — LACTATED RINGERS IV SOLN: INTRAVENOUS | Status: DC

## 2022-11-17 MED ORDER — CHLORHEXIDINE GLUCONATE 0.12 % MT SOLN
OROMUCOSAL | Status: AC
  Filled 2022-11-17: qty 15

## 2022-11-17 MED ORDER — ORAL CARE MOUTH RINSE: 15.0000 mL | Freq: Once | OROMUCOSAL | Status: AC

## 2022-11-17 MED ORDER — PROMETHAZINE HCL 25 MG/ML IJ SOLN: 6.2500 mg | INTRAMUSCULAR | Status: DC | PRN

## 2022-11-17 MED ORDER — FENTANYL CITRATE (PF) 100 MCG/2ML IJ SOLN
INTRAMUSCULAR | Status: AC
  Filled 2022-11-17: qty 2

## 2022-11-17 MED ORDER — FENTANYL CITRATE (PF) 100 MCG/2ML IJ SOLN
INTRAMUSCULAR | Status: DC | PRN
  Administered 2022-11-17: 25 ug via INTRAVENOUS
  Administered 2022-11-17 (×2): 50 ug via INTRAVENOUS
  Administered 2022-11-17: 25 ug via INTRAVENOUS
  Administered 2022-11-17: 50 ug via INTRAVENOUS

## 2022-11-17 MED ORDER — DEXAMETHASONE SODIUM PHOSPHATE 10 MG/ML IJ SOLN
INTRAMUSCULAR | Status: DC | PRN
  Administered 2022-11-17: 8 mg via INTRAVENOUS

## 2022-11-17 MED ORDER — PHENYLEPHRINE HCL (PRESSORS) 10 MG/ML IV SOLN
INTRAVENOUS | Status: DC | PRN
  Administered 2022-11-17 (×2): 80 ug via INTRAVENOUS

## 2022-11-17 MED ORDER — IBUPROFEN 600 MG PO TABS
600.0000 mg | ORAL_TABLET | Freq: Four times a day (QID) | ORAL | 0 refills | Status: DC
Start: 2022-11-17 — End: 2023-10-12

## 2022-11-17 MED ORDER — FENTANYL CITRATE (PF) 100 MCG/2ML IJ SOLN
25.0000 ug | INTRAMUSCULAR | Status: DC | PRN
  Administered 2022-11-17 (×2): 25 ug via INTRAVENOUS

## 2022-11-17 MED ORDER — PROPOFOL 10 MG/ML IV BOLUS
INTRAVENOUS | Status: DC | PRN
  Administered 2022-11-17: 150 mg via INTRAVENOUS

## 2022-11-17 MED ORDER — EPHEDRINE SULFATE (PRESSORS) 50 MG/ML IJ SOLN
INTRAMUSCULAR | Status: DC | PRN
  Administered 2022-11-17: 10 mg via INTRAVENOUS
  Administered 2022-11-17: 5 mg via INTRAVENOUS
  Administered 2022-11-17: 10 mg via INTRAVENOUS

## 2022-11-17 MED ORDER — ONDANSETRON HCL 4 MG/2ML IJ SOLN
INTRAMUSCULAR | Status: AC
  Filled 2022-11-17: qty 2

## 2022-11-17 MED ORDER — ACETAMINOPHEN 10 MG/ML IV SOLN: 1000.0000 mg | Freq: Once | INTRAVENOUS | Status: DC | PRN

## 2022-11-17 MED ORDER — OXYCODONE HCL 5 MG PO TABS
5.0000 mg | ORAL_TABLET | Freq: Once | ORAL | Status: AC | PRN
  Administered 2022-11-17: 5 mg via ORAL

## 2022-11-17 MED ORDER — LIDOCAINE HCL (CARDIAC) PF 100 MG/5ML IV SOSY
PREFILLED_SYRINGE | INTRAVENOUS | Status: DC | PRN
  Administered 2022-11-17: 100 mg via INTRAVENOUS

## 2022-11-17 MED ORDER — MIDAZOLAM HCL 2 MG/2ML IJ SOLN
INTRAMUSCULAR | Status: AC
  Filled 2022-11-17: qty 2

## 2022-11-17 MED ORDER — PROPOFOL 10 MG/ML IV BOLUS
INTRAVENOUS | Status: AC
  Filled 2022-11-17: qty 20

## 2022-11-17 MED ORDER — MIDAZOLAM HCL 2 MG/2ML IJ SOLN
INTRAMUSCULAR | Status: DC | PRN
  Administered 2022-11-17: 2 mg via INTRAVENOUS

## 2022-11-17 MED ORDER — SILVER NITRATE-POT NITRATE 75-25 % EX MISC
CUTANEOUS | Status: AC
  Filled 2022-11-17: qty 10

## 2022-11-17 MED ORDER — OXYCODONE HCL 5 MG/5ML PO SOLN: 5.0000 mg | Freq: Once | ORAL | Status: AC | PRN

## 2022-11-17 MED ORDER — LIDOCAINE HCL (PF) 2 % IJ SOLN
INTRAMUSCULAR | Status: AC
  Filled 2022-11-17: qty 5

## 2022-11-17 MED ORDER — ONDANSETRON HCL 4 MG/2ML IJ SOLN
INTRAMUSCULAR | Status: DC | PRN
  Administered 2022-11-17: 4 mg via INTRAVENOUS

## 2022-11-17 MED ORDER — CHLORHEXIDINE GLUCONATE 0.12 % MT SOLN
15.0000 mL | Freq: Once | OROMUCOSAL | Status: AC
  Administered 2022-11-17: 15 mL via OROMUCOSAL

## 2022-11-17 MED ORDER — DROPERIDOL 2.5 MG/ML IJ SOLN: 0.6250 mg | Freq: Once | INTRAMUSCULAR | Status: DC | PRN

## 2022-11-17 MED ORDER — OXYCODONE HCL 5 MG PO TABS
ORAL_TABLET | ORAL | Status: AC
  Filled 2022-11-17: qty 1

## 2022-11-17 MED ORDER — DEXAMETHASONE SODIUM PHOSPHATE 10 MG/ML IJ SOLN
INTRAMUSCULAR | Status: AC
  Filled 2022-11-17: qty 1

## 2022-11-17 SURGICAL SUPPLY — 28 items
BAG DRN RND TRDRP ANRFLXCHMBR (UROLOGICAL SUPPLIES)
BAG URINE DRAIN 2000ML AR STRL (UROLOGICAL SUPPLIES) IMPLANT
CATH FOLEY 2WAY  5CC 16FR (CATHETERS)
CATH FOLEY 2WAY 5CC 16FR (CATHETERS)
CATH URTH 16FR FL 2W BLN LF (CATHETERS) IMPLANT
DEVICE MYOSURE LITE (MISCELLANEOUS) IMPLANT
DEVICE MYOSURE REACH (MISCELLANEOUS) IMPLANT
DRSG TELFA 3X8 NADH STRL (GAUZE/BANDAGES/DRESSINGS) IMPLANT
ELECT REM PT RETURN 9FT ADLT (ELECTROSURGICAL)
ELECTRODE REM PT RTRN 9FT ADLT (ELECTROSURGICAL) ×1 IMPLANT
GLOVE BIO SURGEON STRL SZ7 (GLOVE) ×1 IMPLANT
GLOVE BIOGEL PI IND STRL 7.5 (GLOVE) ×1 IMPLANT
GOWN STRL REUS W/ TWL LRG LVL3 (GOWN DISPOSABLE) ×2 IMPLANT
GOWN STRL REUS W/TWL LRG LVL3 (GOWN DISPOSABLE) ×2
IV NS IRRIG 3000ML ARTHROMATIC (IV SOLUTION) ×1 IMPLANT
KIT PROCEDURE FLUENT (KITS) ×1 IMPLANT
KIT TURNOVER CYSTO (KITS) ×1 IMPLANT
MANIFOLD NEPTUNE II (INSTRUMENTS) ×1 IMPLANT
PACK DNC HYST (MISCELLANEOUS) ×1 IMPLANT
PAD OB MATERNITY 4.3X12.25 (PERSONAL CARE ITEMS) ×1 IMPLANT
PAD PREP 24X41 OB/GYN DISP (PERSONAL CARE ITEMS) ×1 IMPLANT
SCRUB CHG 4% DYNA-HEX 4OZ (MISCELLANEOUS) ×1 IMPLANT
SEAL ROD LENS SCOPE MYOSURE (ABLATOR) ×1 IMPLANT
SET CYSTO W/LG BORE CLAMP LF (SET/KITS/TRAYS/PACK) IMPLANT
SURGILUBE 2OZ TUBE FLIPTOP (MISCELLANEOUS) ×1 IMPLANT
TRAP FLUID SMOKE EVACUATOR (MISCELLANEOUS) ×1 IMPLANT
TUBING CONNECTING 10 (TUBING) ×1 IMPLANT
WATER STERILE IRR 500ML POUR (IV SOLUTION) ×1 IMPLANT

## 2022-11-17 NOTE — Transfer of Care (Signed)
Immediate Anesthesia Transfer of Care Note  Patient: Courtney Oneill  Procedure(s) Performed: DILATATION AND CURETTAGE /HYSTEROSCOPY  Patient Location: PACU  Anesthesia Type:General  Level of Consciousness: awake  Airway & Oxygen Therapy: Patient Spontanous Breathing  Post-op Assessment: Report given to RN  Post vital signs: stable  Last Vitals:  Vitals Value Taken Time  BP 121/77 11/17/22 1406  Temp 37 C 11/17/22 1406  Pulse 77 11/17/22 1415  Resp 13 11/17/22 1415  SpO2 100 % 11/17/22 1415  Vitals shown include unvalidated device data.  Last Pain:  Vitals:   11/17/22 1043  TempSrc: Temporal  PainSc: 0-No pain     Past Medical History:  Diagnosis Date   Actinic keratosis 03/05/2022   R ant thigh, bx proven, tx'd with LN2   Allergic state    Anxiety    a. ON BZO (alprazolam) PRN   Asthma 12/13/2013   Asthma without status asthmaticus    Cancer of skin of chest    Cardiomyopathy, dilated    a.) LHC 10/05/2018: EF 45%; b.) TTE 04/24/2017: EF 35%; c.) MPI 05/11/2017: EF 43%; d.) TTE 02/17/2018: EF 40%; e.) MPI 09/17/2018: EF 44%   Chest pain with high risk for cardiac etiology 09/23/2018   Cyst of ovary 03/21/2019   Depression    Diastolic dysfunction    a.) TTE 04/24/2017: EF 35%, glob HK, triv PR, mild MR/TR, G1DD; b.) TTE 02/17/2018: EF 40%, glob HK, triv PR, mild MR/TR, G1DD   Diverticulosis    Essential hypertension 04/15/2017   Facial numbness 09/30/2018   Gastritis    GERD (gastroesophageal reflux disease)    Heart palpitations 04/15/2017   History of cardiac catheterization 10/05/2018   a.) LHC 10/05/2018 - normal coronary anatomy   Hyperlipidemia 12/13/2013   IBS (irritable bowel syndrome)    Incomplete emptying of bladder 12/16/2012   Insomnia    a.) on hypnotic (zolpidem)   Kidney stone 12/16/2012   LBBB (left bundle branch block) 04/15/2017   Raynaud's disease without gangrene 07/05/2015   Recurrent UTI    SOB (shortness of breath) on exertion  04/15/2017   Squamous cell carcinoma of skin 03/05/2022   R prox pretibial - SCCIS, ED&C 04/02/22   Venous insufficiency    Visual floaters, bilateral    Past Surgical History:  Procedure Laterality Date   CESAREAN SECTION     CHOLECYSTECTOMY     COLONOSCOPY WITH PROPOFOL N/A 08/26/2016   Procedure: COLONOSCOPY WITH PROPOFOL;  Surgeon: Christena Deem, MD;  Location: [facility] ENDOSCOPY;  Service: Endoscopy;  Laterality: N/A;   FUNCTIONAL ENDOSCOPIC SINUS SURGERY     LEFT HEART CATH AND CORONARY ANGIOGRAPHY Right 10/05/2018   Procedure: LEFT HEART CATH AND CORONARY ANGIOGRAPHY;  Surgeon: Marcina Millard, MD;  Location: [facility] INVASIVE CV LAB;  Service: Cardiovascular;  Laterality: Right;   ORIF ANKLE FRACTURE Right 01/23/2017   Procedure: OPEN REDUCTION INTERNAL FIXATION (ORIF) ANKLE FRACTURE - FIBULAR ;  Surgeon: Gwyneth Revels, DPM;  Location: [facility] ORS;  Service: Podiatry;  Laterality: Right;   Scheduled Meds: Continuous Infusions:  acetaminophen     lactated ringers     lactated ringers 10 mL/hr at 11/17/22 1106   PRN Meds:.acetaminophen, droperidol, fentaNYL (SUBLIMAZE) injection, oxyCODONE **OR** oxyCODONE, promethazine     Complications: No notable events documented.

## 2022-11-17 NOTE — Op Note (Signed)
Operative Note   Patient: Courtney Oneill  Date of Service: 11/17/2022  DOB: 12-Sep-1955  MRN: [id number]   PRE-OP DIAGNOSIS:  1) Postmenopausal bleeding 2) Thickened endometrium 3) Cervical (uterine) stenosis   POST-OP DIAGNOSIS:  1) Postmenopausal bleeding 2) Thickened endometrium 3) Cervical (uterine) stenosis 4) Endometrial polyps    SURGEON: Surgeon(s) and Role:    Conard Novak, MD - Primary  PROCEDURE: Procedure(s): 1) DILATATION AND CURETTAGE /HYSTEROSCOPY  2) Endometrial polypectomy  ANESTHESIA: General LMA  ESTIMATED BLOOD LOSS: 10 mL  DRAINS: none   TOTAL IV FLUIDS: 550 mL  SPECIMENS:  Endometrial curettings and polyps  VTE PROPHYLAXIS: SCDs to the bilateral lower extremities  ANTIBIOTICS: none indicated  FLUID DEFICIT: 140 mL  COMPLICATIONS: none  DISPOSITION: PACU - hemodynamically stable.  CONDITION: stable  INDICATION: 67 y.o. female with postmenopausal bleeding.  Endometrial stripe on pelvic ultrasound was about 9 mm and cystic.  Failed in-office endometrial biopsy.  To OR for definitive diagnosis.   FINDINGS: Exam under anesthesia revealed small, mobile axial uterus with no masses and bilateral adnexa without masses or fullness. Stenotic cervix. Hysteroscopy revealed a grossly normal appearing uterine cavity with a anterior polypoid lesion. There were two other small, broad-based polypoid lesions near the fundus in the midline and near the left cornu.  The bilateral tubal ostia appeared normal and she had a normal appearing endocervical canal.  PROCEDURE IN DETAIL:  After informed consent was obtained, the patient was taken to the operating room where anesthesia was obtained without difficulty. The patient was positioned in the dorsal lithotomy position in [place] stirrups.  The patient's bladder was catheterized with an in and out foley catheter.  The patient was examined under anesthesia, with the above noted findings.  The bi-valved speculum was  placed inside the patient's vagina, and the the anterior lip of the cervix was grasped with the tenaculum.  Then the cervix was progressively dilated to a 7 mm Hegar dilator.  Due to her stenotic cervix, it was necessary to use lacrimal duct probes for the initial dilation.  The hysteroscope was introduced, with the above noted findings. The MyoSure Light device was utilized to remove the polypoid lesion under direct visualization.   The hystersocope was removed and the uterine cavity was curetted until a gritty texture was noted, yielding small endometrial curettings.  The hysteroscope was re-introduced and hemostasis was noted.  The fluid pressure in the uterus was gradually lowered to a level that was well below that of the MAP with ongoing hemostasis noted.   The hysteroscope was removed.  The tenaculum was removed and hemostasis was obtained and silver nitrate applied to ensure ongoing hemostasis.  The vagina was visualized to be free of instruments and sponges.  No bleeding noted from the  cervix.  The speculum was removed. She was then taken out of dorsal lithotomy.  The patient tolerated the procedure well.  Sponge, lap and needle counts were correct x2.  The patient was taken to recovery room in excellent condition.  Conard Novak, MD, FACOG 11/17/2022 1:43 PM

## 2022-11-17 NOTE — Discharge Instructions (Signed)

## 2022-11-17 NOTE — H&P (Addendum)
Preoperative History and Physical  Chief Complaint: Courtney Oneill is a 67 y.o. [id number] here for surgical management of postmenopausal bleeding.   No significant preoperative concerns. She has been assessed for cardiac risk and found to be low risk for the procedure.  History of Present Illness: She has a a history of abnormal discharge/?bleeding. On Sunday 3/17, she woke up with a horrible green, grainy discharge with white and marble-like "stuff" mixed in. There was a lot of this and it was continuous. This continued for weeks. She didn't see a lot of fresh, red blood until later. There was just a little of this. The discharge either looked black or green. She didn't do anything to make it stop. This has never happened since she went through menopause in her 30s. This lasted until she was in her 32s. Her last period was in her 32s.   Last pap smear 07/2021: NILM, HPV negative  Pelvic ultrasound on 10/14/2022 (I have reviewed the images from this study): Uterus retroverted  Thickened complex endometrium=9.63mm  Rt ovary appears wnl Lt complex septated ovarian cyst=2.5cm; septations=0.18cm, 0.12cm, 0.17cm Free fluid seen near Lt ovary  Fibroid seen: anterior=0.61cm  From [facility] note on 10/14/22: "Interval history: - 10/12/22 Started having dark brown/green vaginal discharge, lumpy in nature.  - 10/13/22 noticed vaginal bleeding with mild cramping - 10/14/22 reports continued vaginal discharge that is dark brown and reports mild intermittent cramping; US performed"   She had a failed endometrial biopsy in clinic due to cervical stenosis.   Proposed surgery: Hysteroscopy, dilation and curettage  Past Medical History:  Diagnosis Date   Actinic keratosis 03/05/2022   R ant thigh, bx proven, tx'd with LN2   Allergic state    Anxiety    a. ON BZO (alprazolam) PRN   Asthma 12/13/2013   Asthma without status asthmaticus    Cancer of skin of chest    Cardiomyopathy, dilated     a.) LHC 10/05/2018: EF 45%; b.) TTE 04/24/2017: EF 35%; c.) MPI 05/11/2017: EF 43%; d.) TTE 02/17/2018: EF 40%; e.) MPI 09/17/2018: EF 44%   Chest pain with high risk for cardiac etiology 09/23/2018   Cyst of ovary 03/21/2019   Depression    Diastolic dysfunction    a.) TTE 04/24/2017: EF 35%, glob HK, triv PR, mild MR/TR, G1DD; b.) TTE 02/17/2018: EF 40%, glob HK, triv PR, mild MR/TR, G1DD   Diverticulosis    Essential hypertension 04/15/2017   Facial numbness 09/30/2018   Gastritis    GERD (gastroesophageal reflux disease)    Heart palpitations 04/15/2017   History of cardiac catheterization 10/05/2018   a.) LHC 10/05/2018 - normal coronary anatomy   Hyperlipidemia 12/13/2013   IBS (irritable bowel syndrome)    Incomplete emptying of bladder 12/16/2012   Insomnia    a.) on hypnotic (zolpidem)   Kidney stone 12/16/2012   LBBB (left bundle branch block) 04/15/2017   Raynaud's disease without gangrene 07/05/2015   Recurrent UTI    SOB (shortness of breath) on exertion 04/15/2017   Squamous cell carcinoma of skin 03/05/2022   R prox pretibial - SCCIS, ED&C 04/02/22   Venous insufficiency    Visual floaters, bilateral    Past Surgical History:  Procedure Laterality Date   CESAREAN SECTION     CHOLECYSTECTOMY     COLONOSCOPY WITH PROPOFOL N/A 08/26/2016   Procedure: COLONOSCOPY WITH PROPOFOL;  Surgeon: Christena Deem, MD;  Location: [facility] ENDOSCOPY;  Service: Endoscopy;  Laterality: N/A;   FUNCTIONAL  ENDOSCOPIC SINUS SURGERY     LEFT HEART CATH AND CORONARY ANGIOGRAPHY Right 10/05/2018   Procedure: LEFT HEART CATH AND CORONARY ANGIOGRAPHY;  Surgeon: Marcina Millard, MD;  Location: [facility] INVASIVE CV LAB;  Service: Cardiovascular;  Laterality: Right;   ORIF ANKLE FRACTURE Right 01/23/2017   Procedure: OPEN REDUCTION INTERNAL FIXATION (ORIF) ANKLE FRACTURE - FIBULAR ;  Surgeon: Gwyneth Revels, DPM;  Location: [facility] ORS;  Service: Podiatry;  Laterality: Right;   OB History  No  obstetric history on file.  Patient denies any other pertinent gynecologic issues.   No current facility-administered medications on file prior to encounter.   Current Outpatient Medications on File Prior to Encounter  Medication Sig Dispense Refill   amLODipine (NORVASC) 5 MG tablet Take 5 mg by mouth daily.     carvedilol (COREG) 12.5 MG tablet Take 12.5 mg by mouth 2 (two) times daily with a meal.     FLOVENT HFA 110 MCG/ACT inhaler Inhale 2 puffs into the lungs 2 (two) times daily.     montelukast (SINGULAIR) 10 MG tablet Take 10 mg by mouth at bedtime.     omeprazole (PRILOSEC) 40 MG capsule Take 40 mg by mouth daily.     zolpidem (AMBIEN CR) 12.5 MG CR tablet Take 12.5 mg by mouth at bedtime.     albuterol (PROVENTIL) (2.5 MG/3ML) 0.083% nebulizer solution Take 2.5 mg by nebulization every 6 (six) hours as needed for wheezing or shortness of breath.     amphetamine-dextroamphetamine (ADDERALL) 5 MG tablet Take by mouth. (Patient not taking: Reported on 11/12/2022)     azelastine (OPTIVAR) 0.05 % ophthalmic solution Place 1 drop into both eyes 2 (two) times daily as needed (allergies).   0   conjugated estrogens (PREMARIN) vaginal cream Place 1 Applicatorful vaginally daily as needed (irritation).  (Patient not taking: Reported on 11/12/2022)     fluconazole (DIFLUCAN) 150 MG tablet      fluorouracil (EFUDEX) 5 % cream Apply topically 2 (two) times daily. Use for 1 week to the spot at your lip. Can also use at forearm twice a day for 2 weeks. 40 g 0   hydroquinone 4 % cream Apply to dark spots twice daily until improved. (Patient not taking: Reported on 11/12/2022) 28.35 g 0   hyoscyamine (LEVSIN SL) 0.125 MG SL tablet  (Patient not taking: Reported on 11/12/2022)     ibuprofen (ADVIL,MOTRIN) 200 MG tablet Take 400 mg by mouth every 6 (six) hours as needed for headache or moderate pain.     metroNIDAZOLE (METROGEL) 1 % gel Apply topically daily. (Patient not taking: Reported on 11/12/2022) 45 g  11   mometasone (ELOCON) 0.1 % cream Apply 1 application topically in the morning and at bedtime. Use for up to 5 days a week until clear. Apply to rash at neck (Patient not taking: Reported on 11/12/2022) 45 g 1   PROAIR HFA 108 (90 Base) MCG/ACT inhaler Inhale 1-2 puffs into the lungs every 4 (four) hours as needed for wheezing or shortness of breath.   0   Allergies  Allergen Reactions   Sulfa Antibiotics Swelling    Swelling in mouth     Social History:   reports that she has quit smoking. She has never used smokeless tobacco. She reports current alcohol use of about 3.0 - 4.0 standard drinks of alcohol per week. She reports that she does not use drugs.  Family History  Problem Relation Age of Onset   Pancreatic cancer Mother 26  Breast cancer Maternal Aunt     Review of Systems: Noncontributory  PHYSICAL EXAM: Blood pressure 116/87, pulse 79, temperature (!) 97.4 F (36.3 C), temperature source Temporal, resp. rate 15, height  (1.676 m), weight 66.7 kg, SpO2 100 %. CONSTITUTIONAL: Well-developed, well-nourished female in no acute distress.  HENT:  Normocephalic, atraumatic, External right and left ear normal. Oropharynx is clear and moist EYES: Conjunctivae and EOM are normal. Pupils are equal, round, and reactive to light. No scleral icterus.  NECK: Normal range of motion, supple, no masses SKIN: Skin is warm and dry. No rash noted. Not diaphoretic. No erythema. No pallor. NEUROLGIC: Alert and oriented to person, place, and time. Normal reflexes, muscle tone coordination. No cranial nerve deficit noted. PSYCHIATRIC: Normal mood and affect. Normal behavior. Normal judgment and thought content. CARDIOVASCULAR: Normal heart rate noted, regular rhythm RESPIRATORY: Effort and breath sounds normal, no problems with respiration noted ABDOMEN: Soft, nontender, nondistended. PELVIC: Deferred MUSCULOSKELETAL: Normal range of motion. No edema and no tenderness. 2+ distal  pulses.  Labs: Results for orders placed or performed during the hospital encounter of 11/17/22 (from the past 336 hour(s))  ABO/Rh   Collection Time: 11/17/22 10:46 AM  Result Value Ref Range   ABO/RH(D)      O POS Performed at [facility], [address] [place], [place] [address]   Results for orders placed or performed during the hospital encounter of 11/13/22 (from the past 336 hour(s))  CBC   Collection Time: 11/13/22 10:36 AM  Result Value Ref Range   WBC 8.2 4.0 - 10.5 K/uL   RBC 4.13 3.87 - 5.11 MIL/uL   Hemoglobin 13.5 12.0 - 15.0 g/dL   HCT 98.1 19.1 - 47.8 %   MCV 98.3 80.0 - 100.0 fL   MCH 32.7 26.0 - 34.0 pg   MCHC 33.3 30.0 - 36.0 g/dL   RDW 29.5 62.1 - 30.8 %   Platelets 268 150 - 400 K/uL   nRBC 0.0 0.0 - 0.2 %  Type and screen [facility]   Collection Time: 11/13/22 10:36 AM  Result Value Ref Range   ABO/RH(D) O POS    Antibody Screen NEG    Sample Expiration 11/27/2022,2359    Extend sample reason      NO TRANSFUSIONS OR PREGNANCY IN THE PAST 3 MONTHS Performed at [facility], [address], [place], [place] [address]     Imaging Studies: No results found.  Assessment: Postmenopausal Bleeding Cervical (uterine) stenosis  Plan: Patient will undergo surgical management with the above-noted surgery.   The risks of surgery were discussed in detail with the patient including but not limited to: bleeding which may require transfusion or reoperation; infection which may require antibiotics; injury to surrounding organs which may involve bowel, bladder, ureters ; need for additional procedures including laparoscopy or laparotomy; thromboembolic phenomenon, surgical site problems and other postoperative/anesthesia complications. Likelihood of success in alleviating the patient's condition was discussed. Routine postoperative instructions will be reviewed with the patient and her family in detail after surgery.  The  patient concurred with the proposed plan, giving informed written consent for the surgery.  Patient has been NPO since last night she will remain NPO for procedure.  Anesthesia and OR aware.  Preoperative prophylactic antibiotics, as indicated, and SCDs ordered on call to the OR.  To OR when ready.  Thomasene Mohair, MD 11/17/2022 11:59 AM

## 2022-11-17 NOTE — Anesthesia Preprocedure Evaluation (Addendum)
Anesthesia Evaluation  Patient identified by MRN, date of birth, ID band Patient awake    Reviewed: Allergy & Precautions, H&P , NPO status , Patient's Chart, lab work & pertinent test results  Airway Mallampati: III  TM Distance: >3 FB Neck ROM: full    Dental  (+) Implants   Pulmonary asthma , former smoker   Pulmonary exam normal        Cardiovascular Exercise Tolerance: Good hypertension, +CHF (09/17/2018: EF 44%, Diastolic dysfunction 1)  Normal cardiovascular exam+ dysrhythmias (LBBB (left bundle branch block))      Neuro/Psych  PSYCHIATRIC DISORDERS Anxiety Depression    negative neurological ROS     GI/Hepatic Neg liver ROS,GERD  Controlled,,  Endo/Other  negative endocrine ROS    Renal/GU      Musculoskeletal   Abdominal Normal abdominal exam  (+)   Peds  Hematology negative hematology ROS (+)   Anesthesia Other Findings Past Medical History: 03/05/2022: Actinic keratosis     Comment:  R ant thigh, bx proven, tx'd with LN2 No date: Allergic state No date: Anxiety     Comment:  a. ON BZO (alprazolam) PRN 12/13/2013: Asthma No date: Asthma without status asthmaticus No date: Cancer of skin of chest No date: Cardiomyopathy, dilated     Comment:  a.) LHC 10/05/2018: EF 45%; b.) TTE 04/24/2017: EF 35%;               c.) MPI 05/11/2017: EF 43%; d.) TTE 02/17/2018: EF 40%;               e.) MPI 09/17/2018: EF 44% 09/23/2018: Chest pain with high risk for cardiac etiology 03/21/2019: Cyst of ovary No date: Depression No date: Diastolic dysfunction     Comment:  a.) TTE 04/24/2017: EF 35%, glob HK, triv PR, mild               MR/TR, G1DD; b.) TTE 02/17/2018: EF 40%, glob HK, triv               PR, mild MR/TR, G1DD No date: Diverticulosis 04/15/2017: Essential hypertension 09/30/2018: Facial numbness No date: Gastritis No date: GERD (gastroesophageal reflux disease) 04/15/2017: Heart  palpitations 10/05/2018: History of cardiac catheterization     Comment:  a.) LHC 10/05/2018 - normal coronary anatomy 12/13/2013: Hyperlipidemia No date: IBS (irritable bowel syndrome) 12/16/2012: Incomplete emptying of bladder No date: Insomnia     Comment:  a.) on hypnotic (zolpidem) 12/16/2012: Kidney stone 04/15/2017: LBBB (left bundle branch block) 07/05/2015: Raynaud's disease without gangrene No date: Recurrent UTI 04/15/2017: SOB (shortness of breath) on exertion 03/05/2022: Squamous cell carcinoma of skin     Comment:  R prox pretibial - SCCIS, ED&C 04/02/22 No date: Venous insufficiency No date: Visual floaters, bilateral  Past Surgical History: No date: CESAREAN SECTION No date: CHOLECYSTECTOMY 08/26/2016: COLONOSCOPY WITH PROPOFOL; N/A     Comment:  Procedure: COLONOSCOPY WITH PROPOFOL;  Surgeon: Christena Deem, MD;  Location: [facility] ENDOSCOPY;  Service:               Endoscopy;  Laterality: N/A; No date: FUNCTIONAL ENDOSCOPIC SINUS SURGERY 10/05/2018: LEFT HEART CATH AND CORONARY ANGIOGRAPHY; Right     Comment:  Procedure: LEFT HEART CATH AND CORONARY ANGIOGRAPHY;                Surgeon: Marcina Millard, MD;  Location: [facility]  INVASIVE CV LAB;  Service: Cardiovascular;  Laterality:               Right; 01/23/2017: ORIF ANKLE FRACTURE; Right     Comment:  Procedure: OPEN REDUCTION INTERNAL FIXATION (ORIF) ANKLE              FRACTURE - FIBULAR ;  Surgeon: Gwyneth Revels, DPM;                Location: [facility] ORS;  Service: Podiatry;  Laterality:               Right;     Reproductive/Obstetrics negative OB ROS                             Anesthesia Physical Anesthesia Plan  ASA: 2  Anesthesia Plan: General LMA   Post-op Pain Management: Toradol IV (intra-op)* and Ofirmev IV (intra-op)*   Induction: Intravenous  PONV Risk Score and Plan: Dexamethasone, Ondansetron, Midazolam and Treatment may vary due to age  or medical condition  Airway Management Planned: LMA  Additional Equipment:   Intra-op Plan:   Post-operative Plan: Extubation in OR  Informed Consent: I have reviewed the patients History and Physical, chart, labs and discussed the procedure including the risks, benefits and alternatives for the proposed anesthesia with the patient or authorized representative who has indicated his/her understanding and acceptance.     Dental Advisory Given  Plan Discussed with: Anesthesiologist, CRNA and Surgeon  Anesthesia Plan Comments:         Anesthesia Quick Evaluation

## 2022-11-18 ENCOUNTER — Encounter: Payer: Self-pay | Admitting: Obstetrics and Gynecology

## 2022-11-18 LAB — SURGICAL PATHOLOGY

## 2022-11-18 NOTE — Anesthesia Postprocedure Evaluation (Signed)
Anesthesia Post Note  Patient: Courtney Oneill  Procedure(s) Performed: DILATATION AND CURETTAGE /HYSTEROSCOPY  Patient location during evaluation: PACU Anesthesia Type: General Level of consciousness: awake and alert Pain management: pain level controlled Vital Signs Assessment: post-procedure vital signs reviewed and stable Respiratory status: spontaneous breathing, nonlabored ventilation and respiratory function stable Cardiovascular status: blood pressure returned to baseline and stable Postop Assessment: no apparent nausea or vomiting Anesthetic complications: no   No notable events documented.   Last Vitals:  Vitals:   11/17/22 1448 11/17/22 1449  BP:  (!) 118/91  Pulse:  72  Resp:  16  Temp: 36.6 C   SpO2:  98%    Last Pain:  Vitals:   11/17/22 1449  TempSrc:   PainSc: 3                  Foye Deer

## 2022-11-24 ENCOUNTER — Encounter: Payer: Self-pay | Admitting: Obstetrics and Gynecology

## 2023-05-25 ENCOUNTER — Ambulatory Visit
Admission: RE | Admit: 2023-05-25 | Discharge: 2023-05-25 | Disposition: A | Discharge status: Home / Self Care | Payer: BC Managed Care – PPO | Source: Ambulatory Visit

## 2023-05-25 DIAGNOSIS — Z1231 Encounter for screening mammogram for malignant neoplasm of breast: Secondary | ICD-10-CM | POA: Diagnosis present

## 2023-09-10 ENCOUNTER — Ambulatory Visit: Payer: BC Managed Care – PPO | Admitting: Dermatology

## 2023-09-16 ENCOUNTER — Ambulatory Visit: Payer: BC Managed Care – PPO | Admitting: Dermatology

## 2023-10-12 ENCOUNTER — Ambulatory Visit (INDEPENDENT_AMBULATORY_CARE_PROVIDER_SITE_OTHER): Payer: BC Managed Care – PPO | Admitting: Dermatology

## 2023-10-12 ENCOUNTER — Encounter: Payer: Self-pay | Admitting: Dermatology

## 2023-10-12 DIAGNOSIS — I788 Other diseases of capillaries: Secondary | ICD-10-CM

## 2023-10-12 DIAGNOSIS — Z1283 Encounter for screening for malignant neoplasm of skin: Secondary | ICD-10-CM

## 2023-10-12 DIAGNOSIS — Z85828 Personal history of other malignant neoplasm of skin: Secondary | ICD-10-CM

## 2023-10-12 DIAGNOSIS — L814 Other melanin hyperpigmentation: Secondary | ICD-10-CM | POA: Diagnosis not present

## 2023-10-12 DIAGNOSIS — L578 Other skin changes due to chronic exposure to nonionizing radiation: Secondary | ICD-10-CM | POA: Diagnosis not present

## 2023-10-12 DIAGNOSIS — W908XXA Exposure to other nonionizing radiation, initial encounter: Secondary | ICD-10-CM

## 2023-10-12 DIAGNOSIS — D229 Melanocytic nevi, unspecified: Secondary | ICD-10-CM

## 2023-10-12 DIAGNOSIS — D1801 Hemangioma of skin and subcutaneous tissue: Secondary | ICD-10-CM

## 2023-10-12 DIAGNOSIS — L821 Other seborrheic keratosis: Secondary | ICD-10-CM

## 2023-10-12 NOTE — Patient Instructions (Addendum)

## 2023-10-12 NOTE — Progress Notes (Signed)
   Follow-Up Visit   Subjective  Courtney Oneill is a 68 y.o. female who presents for the following: Skin Cancer Screening and Full Body Skin Exam, hx of SCC  The patient presents for Total-Body Skin Exam (TBSE) for skin cancer screening and mole check. The patient has spots, moles and lesions to be evaluated, some may be new or changing and the patient may have concern these could be cancer.    The following portions of the chart were reviewed this encounter and updated as appropriate: medications, allergies, medical history  Review of Systems:  No other skin or systemic complaints except as noted in HPI or Assessment and Plan.  Objective  Well appearing patient in no apparent distress; mood and affect are within normal limits.  A full examination was performed including scalp, head, eyes, ears, nose, lips, neck, chest, axillae, abdomen, back, buttocks, bilateral upper extremities, bilateral lower extremities, hands, feet, fingers, toes, fingernails, and toenails. All findings within normal limits unless otherwise noted below.   Relevant physical exam findings are noted in the Assessment and Plan.    Assessment & Plan   SKIN CANCER SCREENING PERFORMED TODAY.  ACTINIC DAMAGE - Chronic condition, secondary to cumulative UV/sun exposure - diffuse scaly erythematous macules with underlying dyspigmentation - Recommend daily broad spectrum sunscreen SPF 30+ to sun-exposed areas, reapply every 2 hours as needed.  - Staying in the shade or wearing long sleeves, sun glasses (UVA+UVB protection) and wide brim hats (4-inch brim around the entire circumference of the hat) are also recommended for sun protection.  - Call for new or changing lesions.  LENTIGINES, SEBORRHEIC KERATOSES, HEMANGIOMAS - Benign normal skin lesions - Benign-appearing - Call for any changes  MELANOCYTIC NEVI - Tan-brown and/or pink-flesh-colored symmetric macules and papules - Benign appearing on exam today -  Observation - Call clinic for new or changing moles - Recommend daily use of broad spectrum spf 30+ sunscreen to sun-exposed areas.   HISTORY OF SQUAMOUS CELL CARCINOMA OF THE SKIN - No evidence of recurrence today - No lymphadenopathy - Recommend regular full body skin exams - Recommend daily broad spectrum sunscreen SPF 30+ to sun-exposed areas, reapply every 2 hours as needed.  - Call if any new or changing lesions are noted between office visits  Capillaritis Exam: innumerable pinpoint red vascular macules on the lower legs > thighs arms lower back  Plan: Likely benign and secondary to physical activity patient reports she does a lot of walking daily. Not a reason to reduce physical activity Monitor. Return if worsening, painful, darker redness, other changes   MULTIPLE BENIGN NEVI   LENTIGINES   ACTINIC ELASTOSIS   SEBORRHEIC KERATOSES   CHERRY ANGIOMA   CAPILLARITIS   Return in about 1 year (around 10/11/2024) for TBSE, hx of BCC.  IAngelique Holm, CMA, am acting as scribe for Elie Goody, MD .   Documentation: I have reviewed the above documentation for accuracy and completeness, and I agree with the above.  Elie Goody, MD

## 2024-03-04 ENCOUNTER — Other Ambulatory Visit: Payer: Self-pay | Admitting: Internal Medicine

## 2024-03-04 DIAGNOSIS — M79604 Pain in right leg: Secondary | ICD-10-CM

## 2024-03-11 ENCOUNTER — Ambulatory Visit
Admission: RE | Admit: 2024-03-11 | Discharge: 2024-03-11 | Disposition: A | Discharge status: Home / Self Care | Source: Ambulatory Visit | Attending: Internal Medicine | Admitting: Internal Medicine

## 2024-03-11 DIAGNOSIS — M79604 Pain in right leg: Secondary | ICD-10-CM | POA: Diagnosis present

## 2024-04-19 ENCOUNTER — Other Ambulatory Visit: Payer: Self-pay | Admitting: Internal Medicine

## 2024-04-19 DIAGNOSIS — Z1231 Encounter for screening mammogram for malignant neoplasm of breast: Secondary | ICD-10-CM

## 2024-05-25 ENCOUNTER — Ambulatory Visit
Admission: RE | Admit: 2024-05-25 | Discharge: 2024-05-25 | Disposition: A | Discharge status: Home / Self Care | Source: Ambulatory Visit | Attending: Internal Medicine | Admitting: Internal Medicine

## 2024-05-25 DIAGNOSIS — Z1231 Encounter for screening mammogram for malignant neoplasm of breast: Secondary | ICD-10-CM | POA: Diagnosis present

## 2024-08-15 ENCOUNTER — Other Ambulatory Visit: Payer: Self-pay | Admitting: Physician Assistant

## 2024-08-15 DIAGNOSIS — M545 Low back pain, unspecified: Secondary | ICD-10-CM

## 2024-08-18 ENCOUNTER — Ambulatory Visit
Admission: RE | Admit: 2024-08-18 | Discharge: 2024-08-18 | Disposition: A | Discharge status: Home / Self Care | Source: Ambulatory Visit | Attending: Physician Assistant | Admitting: Physician Assistant

## 2024-08-18 DIAGNOSIS — M545 Low back pain, unspecified: Secondary | ICD-10-CM | POA: Insufficient documentation

## 2024-10-11 ENCOUNTER — Ambulatory Visit: Admitting: Dermatology
